# Patient Record
Sex: Female | Born: 1960
Health system: Southern US, Community
[De-identification: ages and names within clinical notes are randomized; demographics above are authoritative.]

## PROBLEM LIST (undated history)

## (undated) DIAGNOSIS — I471 Supraventricular tachycardia: Secondary | ICD-10-CM

## (undated) DIAGNOSIS — F419 Anxiety disorder, unspecified: Secondary | ICD-10-CM

## (undated) DIAGNOSIS — Z87442 Personal history of urinary calculi: Secondary | ICD-10-CM

## (undated) DIAGNOSIS — I441 Atrioventricular block, second degree: Secondary | ICD-10-CM

## (undated) DIAGNOSIS — Z950 Presence of cardiac pacemaker: Secondary | ICD-10-CM

## (undated) DIAGNOSIS — Z720 Tobacco use: Secondary | ICD-10-CM

## (undated) DIAGNOSIS — I4719 Other supraventricular tachycardia: Secondary | ICD-10-CM

## (undated) DIAGNOSIS — IMO0002 Reserved for concepts with insufficient information to code with codable children: Secondary | ICD-10-CM

## (undated) HISTORY — DX: Reserved for concepts with insufficient information to code with codable children: IMO0002

## (undated) HISTORY — PX: TONSILLECTOMY: SHX5217

## (undated) HISTORY — PX: TYMPANOSTOMY TUBE PLACEMENT: SHX32

## (undated) HISTORY — DX: Other supraventricular tachycardia: I47.19

## (undated) HISTORY — DX: Supraventricular tachycardia: I47.1

## (undated) HISTORY — DX: Tobacco use: Z72.0

## (undated) HISTORY — DX: Atrioventricular block, second degree: I44.1

---

## 2001-11-20 ENCOUNTER — Encounter: Admission: RE | Admit: 2001-11-20 | Discharge: 2001-11-20 | Payer: Self-pay | Admitting: Otolaryngology

## 2001-11-20 ENCOUNTER — Encounter: Payer: Self-pay | Admitting: Otolaryngology

## 2004-05-24 ENCOUNTER — Other Ambulatory Visit: Admission: RE | Admit: 2004-05-24 | Discharge: 2004-05-24 | Payer: Self-pay | Admitting: Obstetrics and Gynecology

## 2005-10-16 ENCOUNTER — Other Ambulatory Visit: Admission: RE | Admit: 2005-10-16 | Discharge: 2005-10-16 | Payer: Self-pay | Admitting: Obstetrics and Gynecology

## 2007-01-21 ENCOUNTER — Encounter: Admission: RE | Admit: 2007-01-21 | Discharge: 2007-01-21 | Payer: Self-pay | Admitting: Orthopaedic Surgery

## 2007-03-26 ENCOUNTER — Ambulatory Visit (HOSPITAL_BASED_OUTPATIENT_CLINIC_OR_DEPARTMENT_OTHER): Admission: RE | Admit: 2007-03-26 | Discharge: 2007-03-26 | Payer: Self-pay | Admitting: Orthopaedic Surgery

## 2010-01-14 ENCOUNTER — Encounter: Admission: RE | Admit: 2010-01-14 | Discharge: 2010-01-14 | Payer: Self-pay | Admitting: Obstetrics and Gynecology

## 2010-04-12 ENCOUNTER — Telehealth: Payer: Self-pay | Admitting: Cardiology

## 2010-04-19 ENCOUNTER — Ambulatory Visit: Payer: Self-pay | Admitting: Cardiovascular Disease

## 2010-04-26 ENCOUNTER — Ambulatory Visit (HOSPITAL_COMMUNITY): Admission: RE | Admit: 2010-04-26 | Discharge: 2010-04-26 | Payer: Self-pay | Admitting: Cardiovascular Disease

## 2010-04-26 ENCOUNTER — Encounter: Payer: Self-pay | Admitting: Cardiovascular Disease

## 2010-04-26 ENCOUNTER — Ambulatory Visit: Payer: Self-pay | Admitting: Cardiology

## 2010-04-26 ENCOUNTER — Ambulatory Visit: Payer: Self-pay

## 2010-05-02 ENCOUNTER — Ambulatory Visit: Payer: Self-pay | Admitting: Cardiovascular Disease

## 2010-05-07 HISTORY — PX: ATRIAL ABLATION SURGERY: SHX560

## 2010-05-18 DIAGNOSIS — I498 Other specified cardiac arrhythmias: Secondary | ICD-10-CM | POA: Insufficient documentation

## 2010-05-20 ENCOUNTER — Ambulatory Visit: Payer: Self-pay | Admitting: Internal Medicine

## 2010-05-25 ENCOUNTER — Telehealth (INDEPENDENT_AMBULATORY_CARE_PROVIDER_SITE_OTHER): Payer: Self-pay | Admitting: *Deleted

## 2010-05-26 ENCOUNTER — Encounter: Payer: Self-pay | Admitting: Internal Medicine

## 2010-05-30 ENCOUNTER — Telehealth: Payer: Self-pay | Admitting: Internal Medicine

## 2010-05-30 ENCOUNTER — Encounter: Payer: Self-pay | Admitting: Internal Medicine

## 2010-05-31 ENCOUNTER — Telehealth: Payer: Self-pay | Admitting: Internal Medicine

## 2010-06-02 ENCOUNTER — Inpatient Hospital Stay (HOSPITAL_COMMUNITY): Admission: RE | Admit: 2010-06-02 | Discharge: 2010-06-07 | Payer: Self-pay | Admitting: Internal Medicine

## 2010-06-02 ENCOUNTER — Ambulatory Visit: Payer: Self-pay | Admitting: Internal Medicine

## 2010-06-07 HISTORY — PX: PACEMAKER INSERTION: SHX728

## 2010-06-08 ENCOUNTER — Telehealth: Payer: Self-pay | Admitting: Internal Medicine

## 2010-06-08 ENCOUNTER — Inpatient Hospital Stay (HOSPITAL_COMMUNITY): Admission: EM | Admit: 2010-06-08 | Discharge: 2010-06-10 | Payer: Self-pay | Admitting: Internal Medicine

## 2010-06-08 ENCOUNTER — Ambulatory Visit: Payer: Self-pay | Admitting: Cardiology

## 2010-06-10 ENCOUNTER — Encounter: Payer: Self-pay | Admitting: Internal Medicine

## 2010-06-10 ENCOUNTER — Telehealth: Payer: Self-pay | Admitting: Internal Medicine

## 2010-06-15 ENCOUNTER — Encounter: Payer: Self-pay | Admitting: Internal Medicine

## 2010-06-16 ENCOUNTER — Telehealth: Payer: Self-pay | Admitting: Internal Medicine

## 2010-06-16 ENCOUNTER — Encounter: Payer: Self-pay | Admitting: Internal Medicine

## 2010-06-21 ENCOUNTER — Telehealth: Payer: Self-pay | Admitting: Internal Medicine

## 2010-06-22 ENCOUNTER — Encounter: Payer: Self-pay | Admitting: Internal Medicine

## 2010-06-22 ENCOUNTER — Ambulatory Visit: Payer: Self-pay | Admitting: Internal Medicine

## 2010-06-22 DIAGNOSIS — R509 Fever, unspecified: Secondary | ICD-10-CM | POA: Insufficient documentation

## 2010-06-22 DIAGNOSIS — I441 Atrioventricular block, second degree: Secondary | ICD-10-CM | POA: Insufficient documentation

## 2010-06-23 ENCOUNTER — Encounter: Payer: Self-pay | Admitting: Internal Medicine

## 2010-06-27 LAB — CONVERTED CEMR LAB
MCHC: 33.6 g/dL (ref 30.0–36.0)
Platelets: 340 10*3/uL (ref 150–400)
RDW: 13.3 % (ref 11.5–15.5)
WBC: 9.6 10*3/uL (ref 4.0–10.5)

## 2010-06-29 ENCOUNTER — Ambulatory Visit: Payer: Self-pay | Admitting: Internal Medicine

## 2010-07-06 ENCOUNTER — Telehealth: Payer: Self-pay | Admitting: Internal Medicine

## 2010-07-06 ENCOUNTER — Encounter: Payer: Self-pay | Admitting: Internal Medicine

## 2010-07-06 ENCOUNTER — Telehealth (INDEPENDENT_AMBULATORY_CARE_PROVIDER_SITE_OTHER): Payer: Self-pay | Admitting: *Deleted

## 2010-07-06 DIAGNOSIS — R0789 Other chest pain: Secondary | ICD-10-CM | POA: Insufficient documentation

## 2010-07-07 ENCOUNTER — Encounter: Payer: Self-pay | Admitting: Internal Medicine

## 2010-07-29 ENCOUNTER — Ambulatory Visit: Payer: Self-pay | Admitting: Internal Medicine

## 2010-09-06 ENCOUNTER — Ambulatory Visit: Payer: Self-pay | Admitting: Internal Medicine

## 2010-09-06 NOTE — Progress Notes (Signed)
Summary: re disabilty  Phone Note Call from Patient   Caller: Patient [phone] Reason for Call: Talk to Nurse Summary of Call: pt calling re disability Initial call taken by: Glynda Jaeger,  June 21, 2010 10:54 AM  Follow-up for Phone Call        pt to bring in copy of original form for me to correct and fax into her job.  Jocelyn aware and I will call her tomorrow and let her know when I refax Dennis Bast, RN, BSN  June 21, 2010 11:48 AM

## 2010-09-06 NOTE — Progress Notes (Signed)
Summary: Disability / calling back   Phone Note Call from Patient Call back at Home Phone [phone]   Caller: Patient Reason for Call: Talk to Nurse Summary of Call: Pt has question on Disability papers. pt also has question re dr allred wanted to see her.  Initial call taken by: Roe Coombs,  June 16, 2010 8:29 AM  Follow-up for Phone Call        per jocelyn office [phone] ext [phone].has question regarding paperwork Lorne Skeens  June 16, 2010 5:06 PM  [facility] for Grayce Sessions  I'm out of the office tomorrow  However back in office on Mon.  Spoke with pt will refax papers with 06/23/10 as return to work date on all  Dennis Bast, RN, BSN  June 16, 2010 6:16 PM

## 2010-09-06 NOTE — Assessment & Plan Note (Signed)
Summary: eph/mt   Visit Type:  Pacemaker check Referring Provider:  Dr Kirke Corin Primary Provider:  Harrison Mons, MD Mady Haagensen)  CC:  pacemaker F/U.  History of Present Illness: The patient presents today for routine electrophysiology followup.  Her recent SVT ablation for AVNRT was complicated by AV block which required pacemaker implantation.  She had microlead perforation and therefore required lead revision by Dr Ladona Ridgel.  Since that time, she has done reasonably well.  She reports having fevers and chills this past weekend as well as myalgias.  These symptoms appear to have resolved currently.   The patient denies symptoms of palpitations, chest pain, shortness of breath, orthopnea, PND, lower extremity edema, dizziness, presyncope, syncope, or neurologic sequela. The patient is tolerating medications without difficulties and is otherwise without complaint today.   Problems Prior to Update: 1)  Fever Unspecified  (ICD-780.60) 2)  Second Degree Atrioventricular Heart Block  (ICD-426.13) 3)  Supraventricular Tachycardia  (ICD-427.89)  Medications Prior to Update: 1)  Metoprolol Succinate 50 Mg Xr24h-Tab (Metoprolol Succinate) .... Take One Tablet By Mouth Once Daily 2)  Alprazolam 0.25 Mg Tabs (Alprazolam) .... Uad 3)  Methocarbamol 500 Mg Tabs (Methocarbamol) .... As Needed  Current Medications (verified): 1)  Clonazepam 0.5 Mg Tbdp (Clonazepam) .... Take 1 By Mouth Two Times A Day As Needed 2)  Protonix 40 Mg Tbec (Pantoprazole Sodium) .... Take 1 By Mouth Once Daily 3)  Indomethacin 25 Mg Caps (Indomethacin) .... Take 1 By Mouth Three Times A Day  Allergies: 1)  ! Penicillin 2)  ! * Claritn D  Past History:  Past Medical History: s/p slow pathway ablation for inducible AVNRT 10/11 pacemaker implantation 11/11 for AV block post ablation DDD s/p back surgery x 2 ongoing tobacco use  Past Surgical History: Reviewed history from 05/20/2010 and no changes required. back surgery  twice tonsellectomy endometrial ablation  Vital Signs:  Patient profile:   50 year old female Height:      65 inches Weight:      141 pounds BMI:     23.55 Pulse rate:   100 / minute BP sitting:   98 / 60  (left arm) Cuff size:   regular  Vitals Entered By: Stanton Kidney, EMT-P (June 22, 2010 12:11 PM)   Physical Exam  General:  Well developed, well nourished, in no acute distress. Head:  normocephalic and atraumatic Eyes:  PERRLA/EOM intact; conjunctiva and lids normal. Mouth:  Teeth, gums and palate normal. Oral mucosa normal. Neck:  Neck supple, no JVD. No masses, thyromegaly or abnormal cervical nodes. Chest Wall:  pacemaker pocket is healing nicely without evidence of infection Lungs:  Clear bilaterally to auscultation and percussion. Heart:  RRR, no m/r/g Abdomen:  Bowel sounds positive; abdomen soft and non-tender without masses, organomegaly, or hernias noted. No hepatosplenomegaly. Msk:  Back normal, normal gait. Muscle strength and tone normal. Pulses:  pulses normal in all 4 extremities Extremities:  No clubbing or cyanosis. Neurologic:  Alert and oriented x 3.   PPM Specifications Following MD:  Hillis Range, MD     PPM Vendor:  Medtronic     PPM Model Number:  [id number]     PPM Serial Number:  [id number] PPM DOI:  06/06/2010     PPM Implanting MD:  Lewayne Bunting, MD  Lead 1    Location: RA     DOI: 06/06/2010     Model #: 1308     Serial #: [id number]     Status: active Lead 2  Location: RV     DOI: 06/06/2010     Model #: [id number]     Serial #: [id number]     Status: active  Magnet Response Rate:  BOL 85 ERI 65  Indications:  CHB   PPM Follow Up Remote Check?  No Battery Voltage:  3.04 V     Pacer Dependent:  Yes       PPM Device Measurements Atrium  Amplitude: 2.4 mV, Impedance: 480 ohms, Threshold: 0.5 V at 0.4 msec Right Ventricle  Amplitude: 19.8 mV, Impedance: 520 ohms, Threshold: 0.5 V at 0.4 msec  Episodes MS Episodes:  0     Percent Mode  Switch:  0     Coumadin:  No Atrial Pacing:  0.2%     Ventricular Pacing:  99.8%  Parameters Mode:  DDD     Lower Rate Limit:  60     Upper Rate Limit:  130 Paced AV Delay:  180     Sensed AV Delay:  150 Next Cardiology Appt Due:  09/07/2010 Tech Comments:  Steri strips removed, no redness or edema noted.  Device  function normal.  No parameter changes.  Altha Harm, LPN  June 22, 2010 12:34 PM  MD Comments:  agree  Impression & Recommendations:  Problem # 1:  SECOND DEGREE ATRIOVENTRICULAR HEART BLOCK (ICD-426.13) s/p PPM, underlying rhythm today is 2:1 av block normal pacemaker function and pocket is healing nicely.  I am concerned about subjective fevers, chills, and myalgias from this past weekend.  This may represent sequela of pericardial inflammation s/p microperforation or viral syndrome.  The possibility for pacemaker system infection also exists.  I will therefore obtain CBC with diff and blood cultures today.  She will return in 1 week for re-examination and will contact us in the interim for further fevers or chills.  Other Orders: T-CBC w/Diff ([id number]) T-Culture, Blood AFB ([id number])  Patient Instructions: 1)  Your physician recommends that you schedule a follow-up appointment in: one week with Dr Johney Frame 2)  She needs blood cultures x2 and CBC with DIff 3)  Call if fever above 101

## 2010-09-06 NOTE — Progress Notes (Signed)
Summary: questions about medication   Phone Note From Pharmacy   Caller: [facility] [phone] Summary of Call: Pharmacy have question about medication Clonazepam  Initial call taken by: Judie Grieve,  June 10, 2010 1:46 PM  Follow-up for Phone Call         On D/C from [facility] hospital, pt. was prescrived Clonazepam 0.25 mg twice a day. The Pharmcist called to verify that was  fine to dispense Clonazepam 0.5 mg . Pt. to take 1/2 tablet twice a day. RN let pharmacist know that, was fine to dispense. Follow-up by: Ollen Gross, RN, BSN,  June 10, 2010 2:07 PM

## 2010-09-06 NOTE — Progress Notes (Addendum)
Summary: has question regarding 10/27 procedure  Phone Note From Other Clinic   Caller: JENNIFER FROM [employer]- [phone] OPTION [phone] Request: Talk with Nurse Details of Complaint: Pt having procedure on 10/27. has questions.  Initial call taken by: Lorne Skeens,  May 31, 2010 3:18 PM  Follow-up for Phone Call        spoke with Victorino Dike needs CPT codes and all clinicals faxed.  Thanks Dennis Bast, RN, BSN  May 31, 2010 3:23 PM  jennifer calling back to get clinical info and cpt codes faxed Glynda Jaeger  June 24, 2010 9:43 AM

## 2010-09-06 NOTE — Letter (Signed)
Summary: [facility]  [facility]   Imported By: Marylou Mccoy 06/27/2010 13:53:56  _____________________________________________________________________  External Attachment:    Type:   Image     Comment:   External Document

## 2010-09-06 NOTE — Medication Information (Signed)
Summary: [employer] Initial Review Notice   [employer] Initial Review Notice   Imported By: Roderic Ovens 07/12/2010 15:43:59  _____________________________________________________________________  External Attachment:    Type:   Image     Comment:   External Document

## 2010-09-06 NOTE — Cardiovascular Report (Signed)
Summary: Office Visit   Office Visit   Imported By: Roderic Ovens 07/14/2010 13:57:15  _____________________________________________________________________  External Attachment:    Type:   Image     Comment:   External Document

## 2010-09-06 NOTE — Letter (Signed)
Summary: Return To Work  [facility], Main Office  [address]   [place], [place] [address]   Phone: [phone]  Fax: [phone]    07/06/2010  TO: Leodis Sias IT MAY CONCERN   RE: Lori Gay [address] [id number]   The above named individual is under my medical care and will be out of work until Monday 07/11/10.  If you have any further questions or need additional information, please call.     Sincerely,     Dr. Fayrene Fearing Glendy Barsanti,MD/Kelly Darl Pikes, RN, BSN

## 2010-09-06 NOTE — Letter (Signed)
Summary: FMLA Papers/  FMLA Papers/   Imported By: Erle Crocker 06/17/2010 16:09:17  _____________________________________________________________________  External Attachment:    Type:   Image     Comment:   External Document

## 2010-09-06 NOTE — Progress Notes (Signed)
  Recieved papers via Fax from [employer] for Group Disability insurance sent to [facility]  May 25, 2010 1:09 PM

## 2010-09-06 NOTE — Letter (Signed)
Summary: ELectrophysiology/Ablation Procedure Instructions  [facility], Main Office  [address]   [place], [place] [address]   Phone: [phone]  Fax: [phone]     Electrophysiology/Ablation Procedure Instructions    You are scheduled for a(n) SVT ablation on 06/02/10 at 7:30am with Dr. Johney Frame.  1.  Please come to the Short Stay Center at [facility] at 5:30am on the day of your procedure.  2.  Come prepared to stay overnight.   Please bring your insurance cards and a list of your medications.  3.  Go to Dr Juliann Pulse office on 05/26/10 for lab work.   You do not have to be fasting.  4.  Do not have anything to eat or drink after midnight the night before your procedure.  5.  Do NOT take these medications for 2 days before your procedure unless otherwise instructed:  Metoprolol.  All of your remaining medications may be taken with a small amount of water.  6.  Educational material received  Ablation   * Occasionally, EP studies and ablations can become lengthy.  Please make your family aware of this before your procedure starts.  Average time ranges from 2-8 hours for EP studies/ablations.  Your physician will locate your family after the procedure with the results.  * If you have any questions after you get home, please call the office at [phone]. Dennis Bast, RN

## 2010-09-06 NOTE — Letter (Signed)
Summary: Return To Work  [facility] [employer], Main Office  [address]   [place], [place] [address]   Phone: [phone]  Fax: [phone]    06/22/2010  TO: Leodis Sias IT MAY CONCERN   RE: Lori Gay [address] [place]   The above named individual is under my medical care and may return to work on: 06/27/10.  If you have any further questions or need additional information, please call.     Sincerely,     Dr. Hillis Range, MD

## 2010-09-06 NOTE — Miscellaneous (Signed)
Summary: Device preload  Clinical Lists Changes  Observations: Added new observation of PPM INDICATN: CHB (06/15/2010 10:00) Added new observation of MAGNET RTE: BOL 85 ERI 65 (06/15/2010 10:00) Added new observation of PPMLEADSTAT2: active (06/15/2010 10:00) Added new observation of PPMLEADSER2: [id number] (06/15/2010 10:00) Added new observation of PPMLEADMOD2: 5086  (06/15/2010 10:00) Added new observation of PPMLEADLOC2: RV  (06/15/2010 10:00) Added new observation of PPMLEADSTAT1: active  (06/15/2010 10:00) Added new observation of PPMLEADSER1: [id number]  (06/15/2010 10:00) Added new observation of PPMLEADMOD1: 5086  (06/15/2010 10:00) Added new observation of PPMLEADLOC1: RA  (06/15/2010 10:00) Added new observation of PPM IMP MD: Lewayne Bunting, MD  (06/15/2010 10:00) Added new observation of PPMLEADDOI2: 06/06/2010  (06/15/2010 10:00) Added new observation of PPMLEADDOI1: 06/06/2010  (06/15/2010 10:00) Added new observation of PPM DOI: 06/06/2010  (06/15/2010 10:00) Added new observation of PPM SERL#: [id number]  (06/15/2010 10:00) Added new observation of PPM MODL#: [id number]  (06/15/2010 08:65) Added new observation of PACEMAKERMFG: Medtronic  (06/15/2010 10:00) Added new observation of PACEMAKER MD: Lewayne Bunting, MD  (06/15/2010 10:00)      PPM Specifications Following MD:  Lewayne Bunting, MD     PPM Vendor:  Medtronic     PPM Model Number:  [id number]     PPM Serial Number:  [id number] PPM DOI:  06/06/2010     PPM Implanting MD:  Lewayne Bunting, MD  Lead 1    Location: RA     DOI: 06/06/2010     Model #: 4401     Serial #: [id number]     Status: active Lead 2    Location: RV     DOI: 06/06/2010     Model #: 4034     Serial #: [id number]     Status: active  Magnet Response Rate:  BOL 85 ERI 65  Indications:  CHB

## 2010-09-06 NOTE — Progress Notes (Signed)
Summary: pt needs note to be out of work  Phone Note Call from Patient Call back at Work Phone [phone]   Caller: Patient Reason for Call: Talk to Nurse, Talk to Doctor Summary of Call: pt needs a note faxed for her work to be out for surgery pls fax to [phone] attn: Grayce Sessions Initial call taken by: Omer Jack,  May 30, 2010 11:36 AM  Follow-up for Phone Call        note done and faxed Dennis Bast, RN, BSN  May 30, 2010 3:38 PM

## 2010-09-06 NOTE — Progress Notes (Signed)
Summary: chest pain and hurts to breath  Phone Note Call from Patient   Caller: Patient Reason for Call: Talk to Nurse Summary of Call: pt was dc from hospital 11.1.11. pt is having chest pain and hurts to breath Initial call taken by: Roe Coombs,  June 08, 2010 3:27 PM  Follow-up for Phone Call        pt. having alot of CP. since 11:30am. She was taking Ibuprofen and robaxin and it improved some but is now coming back. Her jaw is starting to hurt.  Her pain is 10/10. Only SOB with breathing. Spoke with Tresa Endo who is working with Dr. Ladona Ridgel who has decided to admit pt. to 3700 for lead revision tomorrow. (See Below) Whitney Maeola Sarah RN  June 08, 2010 4:22 PM  Follow-up by: Whitney Maeola Sarah RN,  June 08, 2010 4:22 PM  Additional Follow-up for Phone Call Additional follow up Details #1::        spoke with Dr Ladona Ridgel regarding pt.  She has taken Ibuprofen three times today and Roboxin the pain is getting progressively worse.  She is s/p SVT ablation w/ CHB and PPM implant secondary to CHB.  Now having CP and SOB.  States hurts worse to take a deep breath.  Will admit patient to 3700.  She will be place on board for lead revision tomorrow with Dr Ladona Ridgel.  Pt aware and will come to [facility] now Dennis Bast, RN, BSN  June 08, 2010 4:19 PM

## 2010-09-06 NOTE — Medication Information (Signed)
Summary: [employer] Initial Review Notice   [employer] Initial Review Notice   Imported By: Roderic Ovens 07/12/2010 15:45:32  _____________________________________________________________________  External Attachment:    Type:   Image     Comment:   External Document

## 2010-09-06 NOTE — Letter (Signed)
Summary: Return To Work  [facility], Main Office  [address]   [place], [place] [address]   Phone: [phone]  Fax: [phone]    05/30/2010  TO: WHOM IT MAY CONCERN   RE: Lori Gay [address] [place]   The above named individual is under my medical care and will be out of work starting 06/02/10 and will return to work on 06/13/10.  If you have any further questions or need additional information, please call.     Sincerely,    Dr. Hillis Range

## 2010-09-06 NOTE — Progress Notes (Signed)
Summary: PAIN UNDER LEFT BREAST  Phone Note Call from Patient Call back at Work Phone [phone]   Caller: Patient Summary of Call: PT HAVING PAINS UNDER LEFT BREAST Initial call taken by: Judie Grieve,  July 06, 2010 9:24 AM  Follow-up for Phone Call        spoke with pt she describes pain as more walks more it hurts when she starts moving around it starts hurting again.  Under left breast hurts worse with deep breath.  Started Monday and has just lingered  Took Indomethacin 25mg  once yesterday  It helped some but she wants Dr Johney Frame To see her  I asked pt to come on to the office Dennis Bast, RN, BSN  July 06, 2010 10:02 AM   Additional Follow-up for Phone Call Additional follow up Details #1::        BP 100/80   will obtain echo Dennis Bast, RN, BSN  July 06, 2010 11:33 AM Echo looked good Dr Johney Frame instructed patient to take Indomethacin 25mg  three times a day for the next two days then decrease to twice daily and wean off.  Patient verbalized understanding.  Note written for her to stay out of work until Monday 07/11/10 Dennis Bast, RN, BSN  July 06, 2010 12:02 PM Additional Follow-up by: Hillis Range, MD,  July 06, 2010 2:38 PM     Appended Document: [facility]     Referring Provider:  Dr Kirke Corin Primary Provider:  Harrison Mons, MD Mady Haagensen)   History of Present Illness: Lori Gay presents today for evaluation of sharp pain under her L breast.  This pain is worse with deep inspiration and chest wall movement.  Pain has been present intermittently for 2 days and is improved with indomethacin.  She denies cough, SOB, fevers, chills, or other symptoms.  She is otherwise without complaint today.  Allergies: 1)  ! Penicillin 2)  ! * Claritn D  Past History:  Past Medical History: Reviewed history from 06/22/2010 and no changes required. s/p slow pathway ablation for inducible AVNRT 10/11 pacemaker implantation 11/11 for AV block post  ablation DDD s/p back surgery x 2 ongoing tobacco use  Social History: Reviewed history from 05/20/2010 and no changes required. Pt lives in [place] with boyfriend.  She works at [employer].  Tob- smokes 1PPD x 15 years.  ETOH- 1-2 glasses of wine per day  Physical Exam  General:  Well developed, well nourished, in no acute distress. Head:  normocephalic and atraumatic Eyes:  PERRLA/EOM intact; conjunctiva and lids normal. Mouth:  Teeth, gums and palate normal. Oral mucosa normal. Neck:  supple Chest Wall:  pacemaker pocket is healing nicely Lungs:  Clear bilaterally to auscultation and percussion. Heart:  RRR, no m/r/g Abdomen:  Bowel sounds positive; abdomen soft and non-tender without masses, organomegaly, or hernias noted. No hepatosplenomegaly. Msk:  moderate ttp over the L axillar and lower rib cage which reproduces her symptoms Extremities:  No clubbing or cyanosis. Neurologic:  Alert and oriented x 3.   PPM Specifications Following MD:  Hillis Range, MD     PPM Vendor:  [employer]tronic     PPM Model Number:  [id number]     PPM Serial Number:  [id number] PPM DOI:  06/06/2010     PPM Implanting MD:  Lewayne Bunting, MD  Lead 1    Location: RA     DOI: 06/06/2010     Model #: 1660     Serial #: [id number]     Status:  active Lead 2    Location: RV     DOI: 06/06/2010     Model #: 2956     Serial #: [id number]     Status: active  Magnet Response Rate:  BOL 85 ERI 65  Indications:  CHB   PPM Follow Up Pacer Dependent:  Yes      Episodes Coumadin:  No  Parameters Mode:  DDD     Lower Rate Limit:  60     Upper Rate Limit:  130 Paced AV Delay:  180     Sensed AV Delay:  150 MD Comments:  normal device parameters underlying rhythm is 2:1 av block  Impression & Recommendations:  Problem # 1:  CHEST PAIN, ATYPICAL (ICD-786.59) The patient presents with atypical chest pain, likely due to residual pericardial inflammation vs chest wall pain.  She is not dyspneic and  appears stable today.  Her pacemaker parameters are also stable. I have reviewed a limited echo performed this am by tech Alona Bene which reveals trivial pericardial effusion without tamponade.  RV/LV size and function are normal.  No significant abnormalities are seen.  She will continue indomethacin and contact my office if symtoms do not resolve within several days. She has asked to be excused from work for the remainder of the week.  I think that this is reasonble.

## 2010-09-06 NOTE — Progress Notes (Signed)
----   Converted from flag ---- Flag Received   ---- 07/06/2010 11:55 AM, Bary Leriche wrote: Minerva Areola, Could you please check and see if Dr. Johney Frame has reviewed the disability forms for patient Shondell Fabel dob 10/10/2060.  It was sent to Dr. on 11-1 and we have not received it back yet.  Thank you. Elease Hashimoto at [employer]. ------------------------------  Appended Document:  Tresa Endo, what is the status on these. As I recall, we filled these out a while ago.

## 2010-09-06 NOTE — Assessment & Plan Note (Signed)
Summary: nep/svt/mt   Visit Type:  Initial Consult Referring Provider:  Dr Lori Gay Primary Provider:  Harrison Mons, MD Lori Gay)  CC:  SVT.  History of Present Illness: [name] is a pleasant 50 yo WF with a h/o tachypalpitations who presents today for EP consultation.  She reports abrupt onset and offset of heart "racing" for years.  These episodes have increased in frequency and duration over the past few years.  She reports 5 years ago receiving IV adenosine with successful termination of tachycardia.  She has tried vagal maneuvers without improvement.  Most recently, she had an episode 8/11 which lasted 4 hours.  She presented to [facility] and was treated with adenosine which terminated her tachycardia.  She was placed on metoprolol.  She reports very short episodes of palpitations since that time. She reports associated dizziness and lightheadedness during SVT but no syncope.  She reports mild dyspnea, but no chest pain.  The patient otherwise is healthy other than tobacco use.  She has no chronic medical conditions.  She denies any excessive alcohol or excessive caffeine intake.    Current Medications (verified): 1)  Metoprolol Succinate 50 Mg Xr24h-Tab (Metoprolol Succinate) .... Take One Tablet By Mouth Once Daily 2)  Alprazolam 0.25 Mg Tabs (Alprazolam) .... Uad 3)  Methocarbamol 500 Mg Tabs (Methocarbamol) .... As Needed  Allergies (verified): 1)  ! Penicillin 2)  ! * Claritn D  Past History:  Past Medical History: SUPRAVENTRICULAR TACHYCARDIA DDD s/p back surgery x 2 ongoing tobacco use  Past Surgical History: back surgery twice tonsellectomy endometrial ablation  Family History: diabetes  Social History: Pt lives in [place] with boyfriend.  She works at [employer].  Tob- smokes 1PPD x 15 years.  ETOH- 1-2 glasses of wine per day  Review of Systems       All systems are reviewed and negative except as listed in the HPI.   Vital  Signs:  Patient profile:   49 year old female Height:      65 inches Weight:      147 pounds BMI:     24.55 Pulse rate:   69 / minute BP sitting:   128 / 82  (left arm)  Vitals Entered By: Lori Gay CMA (May 20, 2010 12:04 PM)  Physical Exam  General:  Well developed, well nourished, in no acute distress. Head:  normocephalic and atraumatic Eyes:  PERRLA/EOM intact; conjunctiva and lids normal. Mouth:  Teeth, gums and palate normal. Oral mucosa normal. Neck:  Neck supple, no JVD. No masses, thyromegaly or abnormal cervical nodes. Lungs:  Clear bilaterally to auscultation and percussion. Heart:  Non-displaced PMI, chest non-tender; regular rate and rhythm, S1, S2 without murmurs, rubs or gallops. Carotid upstroke normal, no bruit. Normal abdominal aortic size, no bruits. Femorals normal pulses, no bruits. Pedals normal pulses. No edema, no varicosities. Abdomen:  Bowel sounds positive; abdomen soft and non-tender without masses, organomegaly, or hernias noted. No hepatosplenomegaly. Msk:  Back normal, normal gait. Muscle strength and tone normal. Pulses:  pulses normal in all 4 extremities Extremities:  No clubbing or cyanosis. Neurologic:  Alert and oriented x 3. Skin:  Intact without lesions or rashes. Cervical Nodes:  no significant adenopathy Psych:  Normal affect.   Echocardiogram  Procedure date:  04/26/2010  Findings:       Echocardiogram  Procedure date:  04/26/2010  Findings:        Study Conclusions    - Left ventricle: The cavity size was normal. Wall  thickness was     normal. Systolic function was normal. The estimated ejection     fraction was in the range of 55% to 60%. Wall motion was normal;     there were no regional wall motion abnormalities. Left ventricular     diastolic function parameters were normal.   - Aortic valve: There was no stenosis.   - Mitral valve: Trivial regurgitation.   - Right ventricle: The cavity size was normal. Systolic  function was     normal.   - Pulmonary arteries: PA peak pressure: 22mm Hg (S).   - Inferior vena cava: The vessel was normal in size; the     respirophasic diameter changes were in the normal range (= 50%);     findings are consistent with normal central venous pressure.   Transthoracic echocardiography. M-mode, complete 2D, spectral   Doppler, and color Doppler. Height: Height: 165.1cm. Height: 65in.   Weight: Weight: 55.3kg. Weight: 121.7lb. Body mass index: BMI:   20.3kg/m^2. Body surface area: BSA: 1.69m^2. Blood pressure: 117/87.   Patient status: Outpatient. Location: [facility]  Left atrium                        Mitral valve   AP dim           27 mm     ------  Peak E vel    95.8 cm/s   -------   AP dim index   1.69 cm/m^2 <2.2    Peak A vel    63.2 cm/s   -------                      Prepared and Electronically Authenticated by    Lori Ancona, MD   [id number]     EKG  Procedure date:  05/22/2010  Findings:      sinus rhythm 69 bpm, PR 154, otherwise normal ekg  Impression & Recommendations:  Problem # 1:  SUPRAVENTRICULAR TACHYCARDIA (ICD-427.89) The patient has symptomatic recurrent SVT previously responsive to adenosine.  She has failed medical therapy and vagal maneuvers have not been helpful.  Therapeutic strategies for SVT including medicine and ablation were discussed in detail with the patient today. Risk, benefits, and alternatives to EP study and radiofrequency ablation were also discussed in detail today. These risks include but are not limited to stroke, bleeding, vascular damage, tamponade, perforation, damage to the heart and other structures, AV block requiring pacemaker, worsening renal function, and death. The patient understands these risk and wishes to proceed.  We will therefore scheduled carto guided EP study and ablation at the next available time.

## 2010-09-06 NOTE — Miscellaneous (Signed)
Summary: blood cultures x 2  Clinical Lists Changes  Orders: Added new Test order of T-Culture, Blood Routine ([id number]) - Signed

## 2010-09-06 NOTE — Progress Notes (Signed)
Summary: question re med  Phone Note Call from Patient   Caller: Patient Reason for Call: Talk to Nurse Summary of Call: pt seen in [place] [facility] for rapid heartbeat-was given metoprolol and won't have enough to last until appt-should she just finish med then come in or does she need refill? uses [facility] pt [phone] or [phone] Initial call taken by: Glynda Jaeger,  April 12, 2010 4:49 PM  Follow-up for Phone Call        I talked with pt--she was seen in [place] [facility] 03/23/10 for fast heart rate and was given Metoprolol--she states the first appointment available here was 05/12/10--she has 11 metoprolol left--I recommended that she call the [place] office to get an appointment there sooner --she agreed with this plan and will call [place] office back and cancel the appointment here if she gets a sooner appt in [place]

## 2010-09-06 NOTE — Letter (Signed)
Summary: [employer] Gwendolyn Lima papers  [employer] [facility]yn [place] papers   Imported By: Cala Bradford Mesiemore 06/17/2010 16:08:39  _____________________________________________________________________  External Attachment:    Type:   Image     Comment:   External Document

## 2010-09-06 NOTE — Assessment & Plan Note (Signed)
Summary: ok per kelly/per check outsf   Visit Type:  Follow-up Referring Gari Hartsell:  Dr Kirke Corin Primary Elias Dennington:  Harrison Mons, MD Mady Haagensen)  CC:   .  History of Present Illness: The patient presents today for electrophysiology followup.  Since her last visit, she has done reasonably well.  Her fevers have resolved.  She remains slightly fatigued.   The patient denies symptoms of palpitations, chest pain, shortness of breath, orthopnea, PND, lower extremity edema, dizziness, presyncope, syncope, or neurologic sequela. The patient is tolerating medications without difficulties and is otherwise without complaint today.   Problems Prior to Update: 1)  Fever Unspecified  (ICD-780.60) 2)  Second Degree Atrioventricular Heart Block  (ICD-426.13) 3)  Supraventricular Tachycardia  (ICD-427.89)  Medications Prior to Update: 1)  Clonazepam 0.5 Mg Tbdp (Clonazepam) .... Take 1 By Mouth Two Times A Day As Needed 2)  Protonix 40 Mg Tbec (Pantoprazole Sodium) .... Take 1 By Mouth Once Daily 3)  Indomethacin 25 Mg Caps (Indomethacin) .... Take 1 By Mouth Three Times A Day  Current Medications (verified): 1)  Clonazepam 0.5 Mg Tbdp (Clonazepam) .... Take 1 By Mouth Two Times A Day As Needed 2)  Protonix 40 Mg Tbec (Pantoprazole Sodium) .... Take 1 By Mouth Once Daily 3)  Indomethacin 25 Mg Caps (Indomethacin) .... Take 1 By Mouth Three Times A Day, As Needed 4)  Robaxin 500 Mg Tabs (Methocarbamol) .... Every 8 Hrs, As Needed  Allergies (verified): 1)  ! Penicillin 2)  ! * Claritn D  Past History:  Past Medical History: Reviewed history from 06/22/2010 and no changes required. s/p slow pathway ablation for inducible AVNRT 10/11 pacemaker implantation 11/11 for AV block post ablation DDD s/p back surgery x 2 ongoing tobacco use  Past Surgical History: Reviewed history from 05/20/2010 and no changes required. back surgery twice tonsellectomy endometrial ablation  Vital Signs:  Patient profile:    50 year old female Height:      65 inches Weight:      145.50 pounds BMI:     24.30 Pulse rate:   92 / minute BP sitting:   98 / 62  (left arm) Cuff size:   regular  Vitals Entered By: Caralee Ates CMA (June 29, 2010 4:33 PM)  Physical Exam  General:  Well developed, well nourished, in no acute distress. Head:  normocephalic and atraumatic Eyes:  PERRLA/EOM intact; conjunctiva and lids normal. Mouth:  Teeth, gums and palate normal. Oral mucosa normal. Neck:  Neck supple, no JVD. No masses, thyromegaly or abnormal cervical nodes. Chest Wall:  pacemaker pocket is healing nicely without evidence of infection Lungs:  Clear bilaterally to auscultation and percussion. Heart:  RRR, no m/r/g Abdomen:  Bowel sounds positive; abdomen soft and non-tender without masses, organomegaly, or hernias noted. No hepatosplenomegaly. Msk:  Back normal, normal gait. Muscle strength and tone normal. Pulses:  pulses normal in all 4 extremities Extremities:  No clubbing or cyanosis. Neurologic:  Alert and oriented x 3.   PPM Specifications Following MD:  Hillis Range, MD     PPM Vendor:  [employer]     PPM Model Number:  [id number]     PPM Serial Number:  [id number] PPM DOI:  06/06/2010     PPM Implanting MD:  Lewayne Bunting, MD  Lead 1    Location: [facility]     DOI: 06/06/2010     Model #: 4098     Serial #: [id number]     Status: active Lead 2  Location: RV     DOI: 06/06/2010     Model #: 7846     Serial #: [id number]     Status: active  Magnet Response Rate:  BOL 85 ERI 65  Indications:  CHB   PPM Follow Up Pacer Dependent:  Yes      Episodes Coumadin:  No  Parameters Mode:  DDD     Lower Rate Limit:  60     Upper Rate Limit:  130 Paced AV Delay:  180     Sensed AV Delay:  150  Impression & Recommendations:  Problem # 1:  FEVER UNSPECIFIED (ICD-780.60) resolved blood cultures negative x 2 WBC normal and pacemaker pocket looks good  Hopefully fevers are due to resolving pericardial  inflammation.    Problem # 2:  SECOND DEGREE ATRIOVENTRICULAR HEART BLOCK (ICD-426.13) stable no changes today  Patient Instructions: 1)  Your physician recommends that you schedule a follow-up appointment in: 4 weeks with Dr Johney Frame 2)  She will contact our office if any problems arise in the interim

## 2010-09-06 NOTE — Procedures (Signed)
Summary: [facility]   Current Medications (verified): 1)  Clonazepam 0.5 Mg Tbdp (Clonazepam) .... Take 1 By Mouth Two Times A Day As Needed 2)  Protonix 40 Mg Tbec (Pantoprazole Sodium) .... Take 1 By Mouth Once Daily 3)  Indomethacin 25 Mg Caps (Indomethacin) .... Take 1 By Mouth Three Times A Day, As Needed 4)  Robaxin 500 Mg Tabs (Methocarbamol) .... Every 8 Hrs, As Needed  Allergies (verified): 1)  ! Penicillin 2)  ! * Claritn D  PPM Specifications Following MD:  Hillis Range, MD     PPM Vendor:  [employer]     PPM Model Number:  [id number]     PPM Serial Number:  [id number] PPM DOI:  06/06/2010     PPM Implanting MD:  Lewayne Bunting, MD  Lead 1    Location: RA     DOI: 06/06/2010     Model #: 4098     Serial #: [id number]     Status: active Lead 2    Location: RV     DOI: 06/06/2010     Model #: 5621     Serial #: [id number]     Status: active  Magnet Response Rate:  BOL 85 ERI 65  Indications:  CHB   PPM Follow Up Battery Voltage:  3.03 V     Pacer Dependent:  Yes       PPM Device Measurements Atrium  Amplitude: 2.5 mV, Impedance: 496 ohms, Threshold: 0.5 V at 0.4 msec Right Ventricle  Impedance: 552 ohms, Threshold: 1.0 V at 0.4 msec  Episodes MS Episodes:  0     Coumadin:  No Ventricular High Rate:  0     Atrial Pacing:  0.5%     Ventricular Pacing:  99.8%  Parameters Mode:  DDD     Lower Rate Limit:  60     Upper Rate Limit:  130 Paced AV Delay:  180     Sensed AV Delay:  150 Next Cardiology Appt Due:  07/29/2010 Tech Comments:  NORMAL DEVICE FUNCTION.  NO EPISODES SINCE LAST CHECK.  NO CHANGES MADE. ROV 07-29-10 @ 1500 W/JA. Vella Kohler  July 07, 2010 4:25 PM

## 2010-09-08 NOTE — Cardiovascular Report (Signed)
Summary: Office Visit   Office Visit   Imported By: Roderic Ovens 08/12/2010 12:29:11  _____________________________________________________________________  External Attachment:    Type:   Image     Comment:   External Document

## 2010-09-08 NOTE — Cardiovascular Report (Signed)
Summary: Office Visit   Office Visit   Imported By: Roderic Ovens 07/18/2010 13:12:42  _____________________________________________________________________  External Attachment:    Type:   Image     Comment:   External Document

## 2010-09-08 NOTE — Assessment & Plan Note (Signed)
Summary: pc2/ gd   Visit Type:  Follow-up Referring Provider:  Dr Kirke Corin Primary Provider:  Harrison Mons, MD Mady Haagensen)   History of Present Illness: [name] presents today for EP follow-up.  She states that the pain under her L breast has much improved.  She rarely has episodes at this time and feels that this pain quickly resolves with NSAIDs.   She denies cough, SOB, fevers, chills, or other symptoms.  She is otherwise without complaint today.  Current Medications (verified): 1)  Clonazepam 0.5 Mg Tbdp (Clonazepam) .... Take 1 By Mouth Two Times A Day As Needed 2)  Protonix 40 Mg Tbec (Pantoprazole Sodium) .... Take 1 By Mouth Once Daily 3)  Indomethacin 25 Mg Caps (Indomethacin) .... Take 1 By Mouth Three Times A Day, As Needed 4)  Robaxin 500 Mg Tabs (Methocarbamol) .... Every 8 Hrs, As Needed  Allergies: 1)  ! Penicillin 2)  ! * Claritn D  Past History:  Past Medical History: Reviewed history from 06/22/2010 and no changes required. s/p slow pathway ablation for inducible AVNRT 10/11 pacemaker implantation 11/11 for AV block post ablation DDD s/p back surgery x 2 ongoing tobacco use  Past Surgical History: Reviewed history from 05/20/2010 and no changes required. back surgery twice tonsellectomy endometrial ablation  Social History: Pt lives in [place] and has recently married.  She works at [employer].  Tob- smokes 1PPD x 15 years.  ETOH- 1-2 glasses of wine per day  Vital Signs:  Patient profile:   50 year old female Height:      65 inches Weight:      143 pounds BMI:     23.88 Pulse rate:   95 / minute BP sitting:   126 / 72  (left arm)  Vitals Entered By: Laurance Flatten CMA (July 29, 2010 3:08 PM)  Physical Exam  General:  Well developed, well nourished, in no acute distress. Head:  normocephalic and atraumatic Eyes:  PERRLA/EOM intact; conjunctiva and lids normal. Mouth:  Teeth, gums and palate normal. Oral mucosa normal. Neck:   supple Chest Wall:  pacemaker pocket is healing nicely,  there was a 0.18mm stitch exposed and not dissolved.  This stitch was easily removed with forceps and the area cleaned with alcohol prep.  A bandade was applied.  There was no drainage/ exudate/ warmth/ redness, or any concerning findings Lungs:  Clear bilaterally to auscultation and percussion. Heart:  RRR, no m/r/g Abdomen:  Bowel sounds positive; abdomen soft and non-tender without masses, organomegaly, or hernias noted. No hepatosplenomegaly. Msk:  moderate ttp over the L axillar and lower rib cage which reproduces her symptoms Extremities:  No clubbing or cyanosis. Neurologic:  Alert and oriented x 3.   PPM Specifications Following MD:  Hillis Range, MD     PPM Vendor:  [employer]     PPM Model Number:  [id number]     PPM Serial Number:  [id number] PPM DOI:  06/06/2010     PPM Implanting MD:  Lewayne Bunting, MD  Lead 1    Location: RA     DOI: 06/06/2010     Model #: 4098     Serial #: [id number]     Status: active Lead 2    Location: RV     DOI: 06/06/2010     Model #: 5621     Serial #: [id number]     Status: active  Magnet Response Rate:  BOL 85 ERI 65  Indications:  CHB   PPM Follow  Up Remote Check?  No Battery Voltage:  3.03 V     Pacer Dependent:  Yes       PPM Device Measurements Atrium  Amplitude: 2.8 mV, Impedance: 568 ohms, Threshold: 0.5 V at 0.4 msec Right Ventricle  Amplitude: 15.7 mV, Impedance: 520 ohms, Threshold: 0.5 V at 0.4 msec  Episodes MS Episodes:  0     Percent Mode Switch:  0     Coumadin:  No Ventricular High Rate:  0     Atrial Pacing:  0.5%     Ventricular Pacing:  99.7%  Parameters Mode:  DDD     Lower Rate Limit:  60     Upper Rate Limit:  130 Paced AV Delay:  180     Sensed AV Delay:  150 Next Cardiology Appt Due:  05/08/2011 Tech Comments:  Outputs reprogrammed for chronic thresholds.  Device function normal.  ROV 10/12 with Dr. Johney Frame. Altha Harm, LPN  July 29, 2010 3:17 PM  MD  Comments:  normal device function  Impression & Recommendations:  Problem # 1:  CHEST PAIN, ATYPICAL (ICD-786.59) improved intermittent NSAIDs she will contact my office if she develops further progression of chest pain, SOB, or any other concerns  Problem # 2:  SECOND DEGREE ATRIOVENTRICULAR HEART BLOCK (ICD-426.13) normal pacemaker function  Problem # 3:  SUPRAVENTRICULAR TACHYCARDIA (ICD-427.89) resolved  Problem # 4:  FEVER UNSPECIFIED (ICD-780.60) likely due to prior pericarditis,  cultures were negative fevers have completely resolved and pacemaker pocket looks good.  Patient Instructions: 1)  Your physician recommends that you schedule a follow-up appointment in: 2 months with Dr Johney Frame

## 2010-09-29 ENCOUNTER — Encounter: Payer: Self-pay | Admitting: Internal Medicine

## 2010-09-29 ENCOUNTER — Encounter (INDEPENDENT_AMBULATORY_CARE_PROVIDER_SITE_OTHER): Payer: PRIVATE HEALTH INSURANCE | Admitting: Internal Medicine

## 2010-09-29 DIAGNOSIS — I442 Atrioventricular block, complete: Secondary | ICD-10-CM

## 2010-09-29 DIAGNOSIS — I441 Atrioventricular block, second degree: Secondary | ICD-10-CM

## 2010-09-29 DIAGNOSIS — F17200 Nicotine dependence, unspecified, uncomplicated: Secondary | ICD-10-CM | POA: Insufficient documentation

## 2010-10-04 NOTE — Assessment & Plan Note (Signed)
Summary: 2 TreatmentWindow.is pacer check   Visit Type:  PPM-[employer] Referring Provider:  Dr Kirke Corin Primary Provider:  Harrison Mons, MD Mady Haagensen)  CC:  no complaints.  History of Present Illness: The patient presents today for routine electrophysiology followup. She reports doing very well since last being seen in our clinic.  She has no further chest pain.  She feels that her energy is much improved.  She has returned to work full time and is doing very well.  She reports rare mild discomfort over her pacemaker site.  She denies symptoms of palpitations, shortness of breath, orthopnea, PND, lower extremity edema, dizziness, presyncope, syncope, fevers, chills, or neurologic sequela. The patient is tolerating medications without difficulties and is otherwise without complaint today.   Current Medications (verified): 1)  Robaxin 500 Mg Tabs (Methocarbamol) .... Every 8 Hrs, As Needed  Allergies (verified): 1)  ! Penicillin 2)  ! * Claritn D  Past History:  Past Medical History: Reviewed history from 06/22/2010 and no changes required. s/p slow pathway ablation for inducible AVNRT 10/11 pacemaker implantation 11/11 for AV block post ablation DDD s/p back surgery x 2 ongoing tobacco use  Past Surgical History: Reviewed history from 05/20/2010 and no changes required. back surgery twice tonsellectomy endometrial ablation  Social History: Reviewed history from 07/29/2010 and no changes required. Pt lives in [place] and has recently married.  She works at [employer].  Tob- smokes 1PPD x 15 years.  ETOH- 1-2 glasses of wine per day  Vital Signs:  Patient profile:   50 year old female Height:      65 inches Weight:      140.25 pounds BMI:     23.42 Pulse rate:   79 / minute BP sitting:   111 / 82  (left arm) Cuff size:   regular  Vitals Entered By: Caralee Ates CMA (September 29, 2010 4:15 PM)  Physical Exam  General:  Well developed, well nourished, in no  acute distress. Head:  normocephalic and atraumatic Eyes:  PERRLA/EOM intact; conjunctiva and lids normal. Mouth:  Teeth, gums and palate normal. Oral mucosa normal. Neck:  supple Chest Wall:  Pacemaker pocket is well healed and nontender, no evidence of infection Lungs:  Clear bilaterally to auscultation and percussion. Heart:  Non-displaced PMI, chest non-tender; regular rate and rhythm, S1, S2 without murmurs, rubs or gallops. Carotid upstroke normal, no bruit. Normal abdominal aortic size, no bruits. Femorals normal pulses, no bruits. Pedals normal pulses. No edema, no varicosities. Abdomen:  Bowel sounds positive; abdomen soft and non-tender without masses, organomegaly, or hernias noted. No hepatosplenomegaly. Msk:  Back normal, normal gait. Muscle strength and tone normal. Extremities:  No clubbing or cyanosis. Neurologic:  Alert and oriented x 3. Skin:  Intact without lesions or rashes. Psych:  Normal affect.   PPM Specifications Following MD:  Hillis Range, MD     PPM Vendor:  [employer]     PPM Model Number:  [id number]     PPM Serial Number:  [id number] PPM DOI:  06/06/2010     PPM Implanting MD:  Lewayne Bunting, MD  Lead 1    Location: RA     DOI: 06/06/2010     Model #: [id number]     Serial #: [id number]     Status: active Lead 2    Location: RV     DOI: 06/06/2010     Model #: 8657     Serial #: [id number]     Status: active  Magnet Response  Rate:  BOL 85 ERI 65  Indications:  CHB   PPM Follow Up Pacer Dependent:  Yes      Episodes Coumadin:  No  Parameters Mode:  DDD     Lower Rate Limit:  60     Upper Rate Limit:  130 Paced AV Delay:  180     Sensed AV Delay:  150 MD Comments:  see scanned report  Impression & Recommendations:  Problem # 1:  SECOND DEGREE ATRIOVENTRICULAR HEART BLOCK (ICD-426.13) doing very well s/p PPM implantation normal pacemaker function today see scanned report  Problem # 2:  CHEST PAIN, ATYPICAL (ICD-786.59) resolved  Problem # 3:   SUPRAVENTRICULAR TACHYCARDIA (ICD-427.89) resolved s/p ablation  Problem # 4:  NICOTINE ADDICTION (ICD-305.1) cessation of tobacco advised  Patient Instructions: 1)  Your physician recommends that you schedule a follow-up appointment in: october with Dr. Johney Frame 2)  Your physician recommends that you continue on your current medications as directed. Please refer to the Current Medication list given to you today.

## 2010-10-13 NOTE — Cardiovascular Report (Signed)
Summary: Office Visit   Office Visit   Imported By: Roderic Ovens 10/07/2010 12:33:53  _____________________________________________________________________  External Attachment:    Type:   Image     Comment:   External Document

## 2010-10-18 LAB — BASIC METABOLIC PANEL
BUN: 11 mg/dL (ref 6–23)
CO2: 23 mEq/L (ref 19–32)
Calcium: 8.7 mg/dL (ref 8.4–10.5)
Chloride: 112 mEq/L (ref 96–112)
Glucose, Bld: 101 mg/dL — ABNORMAL HIGH (ref 70–99)
Potassium: 3.6 mEq/L (ref 3.5–5.1)
Potassium: 3.6 mEq/L (ref 3.5–5.1)
Sodium: 140 mEq/L (ref 135–145)
Sodium: 141 mEq/L (ref 135–145)

## 2010-10-18 LAB — BLOOD GAS, ARTERIAL
Acid-Base Excess: 0 mmol/L (ref 0.0–2.0)
Drawn by: 34270
O2 Saturation: 96.2 %
Patient temperature: 98.6
TCO2: 20.8 mmol/L (ref 0–100)
pCO2 arterial: 33.2 mmHg — ABNORMAL LOW (ref 35.0–45.0)
pO2, Arterial: 77 mmHg — ABNORMAL LOW (ref 80.0–100.0)

## 2010-10-18 LAB — MRSA PCR SCREENING: MRSA by PCR: NEGATIVE

## 2010-10-18 LAB — D-DIMER, QUANTITATIVE: D-Dimer, Quant: 0.68 ug/mL-FEU — ABNORMAL HIGH (ref 0.00–0.48)

## 2010-10-19 LAB — DIFFERENTIAL
Basophils Absolute: 0 10*3/uL (ref 0.0–0.1)
Basophils Relative: 0 % (ref 0–1)
Eosinophils Absolute: 0.1 10*3/uL (ref 0.0–0.7)
Lymphocytes Relative: 29 % (ref 12–46)
Neutrophils Relative %: 61 % (ref 43–77)

## 2010-10-19 LAB — BASIC METABOLIC PANEL
BUN: 17 mg/dL (ref 6–23)
Calcium: 8.3 mg/dL — ABNORMAL LOW (ref 8.4–10.5)
Chloride: 111 mEq/L (ref 96–112)
Creatinine, Ser: 0.92 mg/dL (ref 0.4–1.2)
GFR calc Af Amer: >60 mL/min (ref >60)
GFR calc non Af Amer: >60 mL/min (ref >60)
Potassium: 4 mEq/L (ref 3.5–5.1)
Sodium: 140 mEq/L (ref 135–145)

## 2010-10-19 LAB — MRSA PCR SCREENING: MRSA by PCR: NEGATIVE

## 2010-10-19 LAB — APTT: aPTT: 31 seconds (ref 24–37)

## 2010-10-19 LAB — HCG, SERUM, QUALITATIVE: Preg, Serum: NEGATIVE

## 2010-10-19 LAB — PROTIME-INR: INR: 1.05 (ref 0.00–1.49)

## 2010-12-20 NOTE — Assessment & Plan Note (Signed)
[facility]                        [facility] OFFICE NOTE   NAME:HEDGECOCKScherry, Laverne                       MRN:          [id number]  DATE:05/02/2010                            DOB:          1961/08/05    Ms. Lori Gay is a 50 year old female who is here today for a followup  visit.  She has the following problems list:  1. Supraventricular tachycardia due to atrioventricular nodal reentry      tachycardia.  2. Tobacco use.  3. Back surgeries.   INTERVAL HISTORY:  I saw Ms. Lori Gay 2 weeks ago for evaluation after  she had an emergency room visit for tachycardia.  At that time, she was  noted to have supraventricular tachycardia which was suggestive of AV  nodal tachycardia with a heart rate of 192.  She converted to a normal  sinus rhythm with adenosine and metoprolol.  During her last visit, I  increased the dose of metoprolol extended release to 50 mg once daily.  She had since then 1 episode that lasted 30 minutes the day after she  saw me.  Her heart rate was 170.  At that time, she was still taking 25  mg daily.  Since she started taking 50 mg daily, she had no prolonged  episode.  She continues to have brief runs of palpitations.   MEDICATIONS:  1. Metoprolol extended release 50 mg once daily.  2. Alprazolam 0.25 mg every 8 hours as needed.  3. Methocarbamol 500 mg every 8 hours as needed for back spasm.   PHYSICAL EXAMINATION:  VITAL SIGNS:  Weight is 143.8 pounds, blood  pressure is 118/77, pulse is 81, oxygen saturation is 97% on room air.  NECK:  Reveals no JVD or carotid bruits.  LUNGS:  Clear to auscultation.  HEART:  Regular rate and rhythm with no gallops or murmurs.  ABDOMEN:  Benign, nontender, nondistended.  EXTREMITIES:  No clubbing, cyanosis, or edema.   IMPRESSION:  Paroxysmal supraventricular tachycardia due to  atrioventricular nodal reentry tachycardia.  We did an echocardiogram  which overall was unremarkable.   She has no evidence of structural heart  disease.  There was normal ejection fraction with no significant  valvular abnormalities.  She continues to have brief runs of  palpitations.  At this time, I discussed management options including an  antiarrhythmic medication versus an ablation procedure.  It seems that  metoprolol is not completely controlling her symptoms.  I favor an  ablation procedure given that she is still young and would like to avoid  the toxicity of antiarrhythmic medications.  This is also the patient's  preferred approach given that she does  not like to take medications on the long-term.  Thus, I will go ahead  and refer her to  Electrophysiology for further evaluation and management.  The patient  can continue to follow up there and she can follow up with me as needed  based on their evaluation.     Lorine Bears, MD  Electronically Signed    MA/MedQ  DD: 05/02/2010  DT: 05/03/2010  Job #: [id number]

## 2010-12-20 NOTE — Letter (Signed)
April 19, 2010    Lori Lefevre, MD  [facility]  [address]  [place], [place] [address]   RE:  Lori Gay, Lori Gay  MRN:  [id number]  /  DOB:  25-Jun-1961   Dear Dr. Particia Nearing:   Thank you for referring Lori Gay for further cardiac evaluation  regarding her previous tachycardia.  This is a pleasant 50 year old  female who started having episodes of tachycardia actually while she was  a teenager.  The episodes were not frequent.  She had one emergency room  visit 5 years ago that required treatment.  However, she did not take  any medications regularly.  She was in her usual health until recently  in August when she presented with fast heartbeats.  The episode lasted  for few hours.  It started suddenly with no warning.  It was associated  with dizziness and lightheadedness, but no syncope.  She had mild  dyspnea, but no chest pain.  Her heart rate was about 170.  She then  went to the emergency room where she was found to have narrow complex  tachycardia.  She was treated with 6 mg of IV adenosine which according  to note slowed it down to 130 beats per minute.  She was then given one  dose of IV metoprolol and subsequently she was in normal sinus rhythm  with a heart rate in the 80s.  The patient was discharged home on  metoprolol extended release 25 mg once daily.  Since that time she had  few episodes since then but they did not last more than few minutes.  Before her emergency room visit, she did have a similar episode about 2  weeks before that, but lasted shorter period of time and she did not  seek medical attention at that time.  The patient otherwise is healthy  other than tobacco use.  She has no chronic medical conditions.  She  denies any excessive alcohol or excessive caffeine intake.  She drinks 2  cups of coffee a day.  She does not have history of thyroid disease.   PAST MEDICAL HISTORY:  1. Supraventricular tachycardia.  2. Tobacco use.  3. Back  surgeries.   MEDICATIONS:  1. Metoprolol extended release 25 mg once daily.  2. Alprazolam 0.25 mg as needed.  3. Methocarbamol 500 mg every 8 hours as needed for back spasm.   ALLERGIES:  PENICILLIN and CLARITIN-D.   SOCIAL HISTORY:  Remarkable for smoking one pack per day for 13 years.  She denies any alcohol or recreational drug use.  The patient works in  loading.   FAMILY HISTORY:  Negative for premature coronary artery disease or  arrhythmia.   PAST SURGICAL HISTORY:  Two back surgeries in 2000.   REVIEW OF SYSTEMS:  Remarkable for palpitations and occasional anxiety.  She does have mild heartburn.  A full review of system was performed and  is otherwise negative.   PHYSICAL EXAMINATION:  GENERAL:  The patient appears to be in her stated  age.  She is in no acute distress.  VITAL SIGNS:  Weight is 142 pounds, blood pressure is 119/82, pulse is  83, oxygen saturation is 97% on room air.  HEENT:  Normocephalic, atraumatic.  NECK:  No JVD or carotid bruits.  There is no thyromegaly.  RESPIRATORY:  Normal respiratory effort with no use of accessory  muscles.  Auscultation reveals normal breath sounds.  CARDIOVASCULAR:  Normal PMI.  Normal S1-S2 with no gallops or murmurs.  ABDOMEN:  Benign, nontender, nondistended.  EXTREMITIES:  No clubbing, cyanosis, or edema.  She has varicose veins  in the lower extremities.  SKIN:  Warm and dry.  MUSCULOSKELETAL:  There is normal muscle strength in the upper and lower  extremities.  PSYCHIATRIC:  She is alert and oriented x3 with normal mood and affect.   An electrocardiogram was performed which showed normal sinus rhythm with  poor R-wave progression in the precordial lead.  Heart rate is 77 beats  per minute.  Her recent laboratory findings from the emergency room were  reviewed.  Electrocardiogram showed narrow complex tachycardia with a  heart rate of 192.  There is pseudo R prime evident in the inferior  leads shortly after the  R-wave consistent with AV nodal reentry  tachycardia (short RP tachycardia).  Her labs were unremarkable except  for elevated white cell count at 17,000.  Thyroid function was normal.   IMPRESSION:  1. Supraventricular tachycardia due to atrioventricular nodal reentry      tachycardia.  The patient has a prolonged history of tachycardia,      but it seems that she had more frequent episodes over the last few      months.  She required one emergency room visit recently for IV      adenosine.  Since she was placed on metoprolol, she had very few      episodes which did not last for a long time.  I had a prolonged      discussion with the patient about the pathophysiology and      management options of supraventricular tachycardia.  First, I would      like to obtain an echocardiogram to rule out any structural heart      disease.  Regarding her management, the first step is to try a beta-      blocker or calcium channel blocker as it is being done.  It seems      like she had improvement with metoprolol.  She is currently on a      small dose and given her a few more episodes she had after that, I      will increase the dose today to 50 mg once daily.  If she fails      metoprolol, then management options include an antiarrhythmic      medication or an ablation procedure.  The patient seems to be in      favor of having a curative procedure given that she is not very      good about taking medications long-term.  2. Tobacco use:  I counseled the patient regarding the importance of      smoking cessation.  She will follow up with me after echocardiogram      to evaluate her response to higher dose metoprolol.   Thank you for allowing me to participate in the care of your patient.    Sincerely,      Lori Bears, MD  Electronically Signed    MA/MedQ  DD: 04/19/2010  DT: 04/20/2010  Job #: [id number]

## 2010-12-20 NOTE — Op Note (Signed)
NAME:  Lori Gay, Lori Gay              ACCOUNT NO.:  [id number]   MEDICAL RECORD NO.:  [id number]          PATIENT TYPE:  AMB   LOCATION:  DSC                          FACILITY:  [facility]   PHYSICIAN:  Sharolyn Douglas, M.D.        DATE OF BIRTH:  1960-11-26   DATE OF PROCEDURE:  03/26/2007  DATE OF DISCHARGE:                               OPERATIVE REPORT   DIAGNOSIS:  Lumbar spondylosis and degenerative disk disease.   PROCEDURES:  1. Bilateral L4-5 facette joint injections.  2. Left L4-5 transforaminal epidural steroid injection.  3. Fluoroscopic imaging used for needle placement of the above      injection.   SURGEON:  Sharolyn Douglas, MD.   ASSISTANT:  None.   ANESTHESIA:  MAC plus local.   COMPLICATIONS:  None.   INDICATIONS:  The patient is a pleasant 50 year old female with  persistent back and left lower extremity pain.  She has failed other  conservative treatment modalities and now presents for the above  injections for further diagnostic and therapeutic purposes.  Risk,  benefits and alternatives were reviewed.  The patient elected to  proceed.   PROCEDURE:  After informed consent, she was taken to the operating room.  She was turned prone.  The back was prepped and draped in the usual  sterile fashion.  She underwent sedation by anesthesia.  Fluoroscopy was  brought into the field and the degenerative L4-5 facette joints were  visualized.  The 22-gauge Quincke spinal needles were advanced into the  joints bilaterally at L4-5.  Injection of 40 mg Depo-Medrol and 1 mL 1%  preservative-free lidocaine carried out into each facette joint.   We then turned our attention to performing a left L4-5 transforaminal  epidural steroid injection.  Using oblique imaging, a 22-gauge 3-1/2-  inch Quincke spinal needle was advanced in a posterior oblique direction  underneath the left L4 pedicle.  Aspiration showed no blood or CSF.  I  injected 1 mL of Omnipaque which showed some epidural spread  as well as  spread along the nerve root.  There was no inner vascular uptake.  We  then injected a solution of 80 mg Depo-Medrol and 3 mL 1% preservative-  free lidocaine.  The patient tolerated the procedure well.  No  apparent complications.  Transferred to recovery in stable condition.  I  reviewed post injection instructions with her.  She was given an out of  all work note for today.  She will return to work at [employer] duty tomorrow  and then back to full work on March 28, 2007.      Sharolyn Douglas, M.D.  Electronically Signed     MC/MEDQ  D:  03/26/2007  T:  03/26/2007  Job:  [id number]

## 2011-03-08 ENCOUNTER — Encounter: Payer: Self-pay | Admitting: Internal Medicine

## 2011-04-20 ENCOUNTER — Encounter: Payer: Self-pay | Admitting: Cardiovascular Disease

## 2011-05-19 LAB — POCT HEMOGLOBIN-HEMACUE: Hemoglobin: 16.7 — ABNORMAL HIGH

## 2011-06-02 ENCOUNTER — Encounter: Payer: Self-pay | Admitting: Internal Medicine

## 2011-06-02 ENCOUNTER — Ambulatory Visit (INDEPENDENT_AMBULATORY_CARE_PROVIDER_SITE_OTHER): Payer: PRIVATE HEALTH INSURANCE | Admitting: Internal Medicine

## 2011-06-02 DIAGNOSIS — I441 Atrioventricular block, second degree: Secondary | ICD-10-CM

## 2011-06-02 DIAGNOSIS — F17200 Nicotine dependence, unspecified, uncomplicated: Secondary | ICD-10-CM

## 2011-06-02 DIAGNOSIS — I442 Atrioventricular block, complete: Secondary | ICD-10-CM

## 2011-06-02 LAB — PACEMAKER DEVICE OBSERVATION
AL AMPLITUDE: 4.1 mv
AL IMPEDENCE PM: 408 Ohm
AL THRESHOLD: 1 v
ATRIAL PACING PM: 4.3
BAMS-0001: 170 {beats}/min
BATTERY VOLTAGE: 3 v
RV LEAD IMPEDENCE PM: 472 Ohm
RV LEAD THRESHOLD: 1 v
VENTRICULAR PACING PM: 99.8

## 2011-06-02 NOTE — Progress Notes (Signed)
The patient presents today for routine electrophysiology followup.  Since last being seen in our clinic, the patient reports doing very well.   She remains active.  She denies any symptoms with her pacemaker at this time.  Today, she denies symptoms of palpitations, chest pain, shortness of breath, orthopnea, PND, lower extremity edema, dizziness, presyncope, syncope, or neurologic sequela.  The patient feels that she is tolerating medications without difficulties and is otherwise without complaint today.   Past Medical History  Diagnosis Date  . DDD (degenerative disc disease)     back surgery x2  . Tobacco user   . Second degree AV block     s/p MDT REVO pacemaker by Dr Graciela Husbands  . AVNRT (AV nodal re-entry tachycardia)     s/p slow pathway ablation by Dr Johney Frame complicated by AV block requiring PPM   Past Surgical History  Procedure Date  . Atrial ablation surgery 10/11    slow pathway; for inducible AVNRT  . Pacemaker insertion 11/11    for AV block post ablation; Medtronic Revo by Dr Graciela Husbands  . Tonsillectomy     Current Outpatient Prescriptions  Medication Sig Dispense Refill  . cetirizine (ZYRTEC) 10 MG tablet Take 10 mg by mouth daily.        . clonazePAM (KLONOPIN) 0.5 MG tablet Take 0.5 mg by mouth 2 (two) times daily as needed.        . methocarbamol (ROBAXIN) 500 MG tablet Take 500 mg by mouth every 8 (eight) hours as needed.          Allergies  Allergen Reactions  . Claritin   . Penicillins     History   Social History  . Marital Status: Married    Spouse Name: N/A    Number of Children: N/A  . Years of Education: N/A   Occupational History  . Not on file.   Social History Main Topics  . Smoking status: Current Everyday Smoker  . Smokeless tobacco: Not on file   Comment: 1 ppd x 15 years   . Alcohol Use: Yes     1-2 glasses of wine/day   . Drug Use: Not on file  . Sexually Active: Not on file   Other Topics Concern  . Not on file   Social History  Narrative   Recently married, lives in [place]; works at [employer].     Family History  Problem Relation Age of Onset  . Diabetes      family hx   Physical Exam: Filed Vitals:   06/02/11 1241  BP: 108/70  Pulse: 90  Resp: 18  Height: 5\' 5"  (1.651 m)  Weight: 144 lb (65.318 kg)    GEN- The patient is well appearing, alert and oriented x 3 today.   Head- normocephalic, atraumatic Eyes-  Sclera clear, conjunctiva pink Ears- hearing intact Oropharynx- clear Neck- supple, no JVP Lymph- no cervical lymphadenopathy Lungs- Clear to ausculation bilaterally, normal work of breathing Chest- R sided pacemaker pocket is well healed Heart- Regular rate and rhythm, no murmurs, rubs or gallops, PMI not laterally displaced GI- soft, NT, ND, + BS Extremities- no clubbing, cyanosis, or edema  Pacemaker interrogation- reviewed in detail today,  See PACEART report  Assessment and Plan:

## 2011-06-02 NOTE — Assessment & Plan Note (Signed)
Cessation advised She will try to cut back but is not ready to quit

## 2011-06-02 NOTE — Patient Instructions (Signed)
Your physician wants you to follow-up in: 12 months. You will receive a reminder letter in the mail two months in advance. If you don't receive a letter, please call our office to schedule the follow-up appointment.  Carelink check on August 31, 2011.

## 2011-06-02 NOTE — Assessment & Plan Note (Signed)
Normal pacemaker function See Pace Art report No changes today  

## 2011-07-21 ENCOUNTER — Other Ambulatory Visit: Payer: Self-pay | Admitting: Obstetrics and Gynecology

## 2011-07-21 DIAGNOSIS — N63 Unspecified lump in unspecified breast: Secondary | ICD-10-CM

## 2011-08-04 ENCOUNTER — Ambulatory Visit
Admission: RE | Admit: 2011-08-04 | Discharge: 2011-08-04 | Disposition: A | Discharge status: Home / Self Care | Payer: PRIVATE HEALTH INSURANCE | Source: Ambulatory Visit | Attending: Obstetrics and Gynecology | Admitting: Obstetrics and Gynecology

## 2011-08-04 DIAGNOSIS — N63 Unspecified lump in unspecified breast: Secondary | ICD-10-CM

## 2011-08-31 ENCOUNTER — Encounter: Payer: PRIVATE HEALTH INSURANCE | Admitting: *Deleted

## 2011-09-04 ENCOUNTER — Encounter: Payer: Self-pay | Admitting: *Deleted

## 2011-09-20 ENCOUNTER — Encounter: Payer: Self-pay | Admitting: Internal Medicine

## 2011-09-20 ENCOUNTER — Telehealth: Payer: Self-pay | Admitting: Internal Medicine

## 2011-09-20 NOTE — Telephone Encounter (Signed)
09-20-11 pt's home # unable to leave message, cell # is work #, will send letter, pt missed January home transmission and needs to send one in/mt

## 2012-01-01 ENCOUNTER — Encounter: Payer: Self-pay | Admitting: *Deleted

## 2012-02-05 ENCOUNTER — Telehealth: Payer: Self-pay | Admitting: Internal Medicine

## 2012-02-05 ENCOUNTER — Encounter: Payer: Self-pay | Admitting: Internal Medicine

## 2012-02-05 ENCOUNTER — Ambulatory Visit (INDEPENDENT_AMBULATORY_CARE_PROVIDER_SITE_OTHER): Payer: PRIVATE HEALTH INSURANCE | Admitting: *Deleted

## 2012-02-05 DIAGNOSIS — I441 Atrioventricular block, second degree: Secondary | ICD-10-CM

## 2012-02-05 NOTE — Telephone Encounter (Signed)
Patient no longer has a home phone but will go to a friend's house to send the transmission and will also call to schedule an appointment with Dr. Johney Frame for October.

## 2012-02-05 NOTE — Telephone Encounter (Signed)
New msg Pt has transmission box but she doesn't have home phone. She wants to know what to do. Please call

## 2012-02-09 LAB — REMOTE PACEMAKER DEVICE
ATRIAL PACING PM: 3.31
BRDY-0003RA: 130 {beats}/min
BRDY-0004RA: 130 {beats}/min
RV LEAD IMPEDENCE PM: 448 Ohm
VENTRICULAR PACING PM: 99.68

## 2012-02-19 ENCOUNTER — Encounter: Payer: Self-pay | Admitting: *Deleted

## 2012-06-13 ENCOUNTER — Encounter: Payer: Self-pay | Admitting: *Deleted

## 2012-06-13 DIAGNOSIS — Z950 Presence of cardiac pacemaker: Secondary | ICD-10-CM | POA: Insufficient documentation

## 2012-06-19 ENCOUNTER — Encounter: Payer: Self-pay | Admitting: Internal Medicine

## 2012-06-19 ENCOUNTER — Ambulatory Visit (INDEPENDENT_AMBULATORY_CARE_PROVIDER_SITE_OTHER): Payer: Commercial Managed Care - PPO | Admitting: Internal Medicine

## 2012-06-19 VITALS — BP 126/66 | HR 104 | Ht 65.0 in | Wt 142.0 lb

## 2012-06-19 DIAGNOSIS — I441 Atrioventricular block, second degree: Secondary | ICD-10-CM

## 2012-06-19 DIAGNOSIS — Z950 Presence of cardiac pacemaker: Secondary | ICD-10-CM

## 2012-06-19 DIAGNOSIS — F17200 Nicotine dependence, unspecified, uncomplicated: Secondary | ICD-10-CM

## 2012-06-19 DIAGNOSIS — I498 Other specified cardiac arrhythmias: Secondary | ICD-10-CM

## 2012-06-19 LAB — PACEMAKER DEVICE OBSERVATION
AL IMPEDENCE PM: 520 Ohm
ATRIAL PACING PM: 3.7
BATTERY VOLTAGE: 3 V
RV LEAD IMPEDENCE PM: 488 Ohm
VENTRICULAR PACING PM: 99.8

## 2012-06-19 NOTE — Patient Instructions (Signed)
Remote monitoring is used to monitor your Pacemaker of ICD from home. This monitoring reduces the number of office visits required to check your device to one time per year. It allows [place] to keep an eye on the functioning of your device to ensure it is working properly. You are scheduled for a device check from home on September 23, 2012. You may send your transmission at any time that day. If you have a wireless device, the transmission will be sent automatically. After your physician reviews your transmission, you will receive a postcard with your next transmission date.  Your physician wants you to follow-up in: 1 year with Dr Johney Frame.  You will receive a reminder letter in the mail two months in advance. If you don't receive a letter, please call our office to schedule the follow-up appointment.

## 2012-06-24 NOTE — Progress Notes (Signed)
The patient presents today for routine electrophysiology followup.  Since last being seen in our clinic, the patient reports doing very well.   She remains active.  She is working full time and spending time with her grandchild.  Today, she denies symptoms of palpitations, chest pain, shortness of breath, lower extremity edema, dizziness, presyncope, syncope, or neurologic sequela.    Past Medical History  Diagnosis Date  . DDD (degenerative disc disease)     back surgery x2  . Tobacco user   . Second degree AV block     s/p MDT REVO pacemaker by Dr Graciela Husbands  . AVNRT (AV nodal re-entry tachycardia)     s/p slow pathway ablation by Dr Johney Frame complicated by AV block requiring PPM   Past Surgical History  Procedure Date  . Atrial ablation surgery 10/11    slow pathway; for inducible AVNRT  . Pacemaker insertion 11/11    for AV block post ablation; Medtronic Revo by Dr Graciela Husbands  . Tonsillectomy     Current Outpatient Prescriptions  Medication Sig Dispense Refill  . cetirizine (ZYRTEC) 10 MG tablet Take 10 mg by mouth daily.        . clonazePAM (KLONOPIN) 0.5 MG tablet Take 0.5 mg by mouth 2 (two) times daily as needed.        . methocarbamol (ROBAXIN) 500 MG tablet Take 500 mg by mouth every 8 (eight) hours as needed.          Allergies  Allergen Reactions  . Loratadine   . Penicillins     History   Social History  . Marital Status: Married    Spouse Name: N/A    Number of Children: N/A  . Years of Education: N/A   Occupational History  . Not on file.   Social History Main Topics  . Smoking status: Current Every Day Smoker  . Smokeless tobacco: Not on file     Comment: 1 ppd x 15 years   . Alcohol Use: Yes     Comment: 1-2 glasses of wine/day   . Drug Use: Not on file  . Sexually Active: Not on file   Other Topics Concern  . Not on file   Social History Narrative   Recently married, lives in [place]; works at [employer].     Family History  Problem  Relation Age of Onset  . Diabetes      family hx   Physical Exam: Filed Vitals:   06/19/12 1413  BP: 126/66  Pulse: 104  Height: 5\' 5"  (1.651 m)  Weight: 142 lb (64.411 kg)  SpO2: 98%    GEN- The patient is well appearing, alert and oriented x 3 today.   Head- normocephalic, atraumatic Eyes-  Sclera clear, conjunctiva pink Ears- hearing intact Oropharynx- clear Neck- supple, no JVP Lymph- no cervical lymphadenopathy Lungs- Clear to ausculation bilaterally, normal work of breathing Chest- R sided pacemaker pocket is well healed Heart- Regular rate and rhythm, no murmurs, rubs or gallops, PMI not laterally displaced GI- soft, NT, ND, + BS Extremities- no clubbing, cyanosis, or edema  Pacemaker interrogation- reviewed in detail today,  See PACEART report  Assessment and Plan:

## 2012-06-24 NOTE — Assessment & Plan Note (Signed)
Normal pacemaker function See Pace Art report No changes today  carelink every 3 months Return in 1year 

## 2012-06-24 NOTE — Assessment & Plan Note (Signed)
Cessation is strongly advised She is not ready to quit 

## 2012-06-24 NOTE — Assessment & Plan Note (Signed)
No symptoms of arrhythmia or SVT

## 2012-09-23 ENCOUNTER — Encounter: Payer: PRIVATE HEALTH INSURANCE | Admitting: *Deleted

## 2012-10-03 ENCOUNTER — Encounter: Payer: Self-pay | Admitting: *Deleted

## 2012-10-28 ENCOUNTER — Encounter: Payer: Self-pay | Admitting: *Deleted

## 2012-12-16 ENCOUNTER — Ambulatory Visit (INDEPENDENT_AMBULATORY_CARE_PROVIDER_SITE_OTHER): Payer: Commercial Managed Care - PPO | Admitting: *Deleted

## 2012-12-16 ENCOUNTER — Encounter: Payer: Self-pay | Admitting: Internal Medicine

## 2012-12-16 DIAGNOSIS — I441 Atrioventricular block, second degree: Secondary | ICD-10-CM

## 2012-12-16 DIAGNOSIS — Z950 Presence of cardiac pacemaker: Secondary | ICD-10-CM

## 2012-12-24 LAB — REMOTE PACEMAKER DEVICE
AL AMPLITUDE: 3.2 mv
BAMS-0001: 170 {beats}/min
RV LEAD IMPEDENCE PM: 456 Ohm

## 2013-01-10 ENCOUNTER — Encounter: Payer: Self-pay | Admitting: *Deleted

## 2013-03-31 ENCOUNTER — Encounter: Payer: PRIVATE HEALTH INSURANCE | Admitting: *Deleted

## 2013-04-03 ENCOUNTER — Encounter: Payer: Self-pay | Admitting: *Deleted

## 2013-04-09 ENCOUNTER — Ambulatory Visit (INDEPENDENT_AMBULATORY_CARE_PROVIDER_SITE_OTHER): Payer: Commercial Managed Care - PPO | Admitting: *Deleted

## 2013-04-09 DIAGNOSIS — I441 Atrioventricular block, second degree: Secondary | ICD-10-CM

## 2013-04-14 ENCOUNTER — Other Ambulatory Visit: Payer: Self-pay | Admitting: *Deleted

## 2013-04-17 LAB — REMOTE PACEMAKER DEVICE
AL AMPLITUDE: 3.5 mv
AL IMPEDENCE PM: 464 Ohm
BATTERY VOLTAGE: 3 V
RV LEAD AMPLITUDE: 20 mv
VENTRICULAR PACING PM: 99.91

## 2013-04-30 ENCOUNTER — Encounter: Payer: Self-pay | Admitting: *Deleted

## 2013-05-09 ENCOUNTER — Encounter: Payer: Self-pay | Admitting: Internal Medicine

## 2013-06-23 ENCOUNTER — Encounter: Payer: Self-pay | Admitting: Internal Medicine

## 2013-06-23 ENCOUNTER — Ambulatory Visit (INDEPENDENT_AMBULATORY_CARE_PROVIDER_SITE_OTHER): Payer: Commercial Managed Care - PPO | Admitting: Internal Medicine

## 2013-06-23 VITALS — BP 110/60 | HR 70 | Ht 65.0 in | Wt 140.0 lb

## 2013-06-23 DIAGNOSIS — I441 Atrioventricular block, second degree: Secondary | ICD-10-CM

## 2013-06-23 DIAGNOSIS — F17200 Nicotine dependence, unspecified, uncomplicated: Secondary | ICD-10-CM

## 2013-06-23 DIAGNOSIS — Z950 Presence of cardiac pacemaker: Secondary | ICD-10-CM

## 2013-06-23 NOTE — Addendum Note (Signed)
Addended by: Dennis Bast F on: 06/23/2013 10:51 AM   Modules accepted: Orders

## 2013-06-23 NOTE — Patient Instructions (Signed)
Remote monitoring is used to monitor your Pacemaker of ICD from home. This monitoring reduces the number of office visits required to check your device to one time per year. It allows [place] to keep an eye on the functioning of your device to ensure it is working properly. You are scheduled for a device check from home on September 24, 2013. You may send your transmission at any time that day. If you have a wireless device, the transmission will be sent automatically. After your physician reviews your transmission, you will receive a postcard with your next transmission date.  Your physician wants you to follow-up in: 1 year with Dr Johney Frame.  You will receive a reminder letter in the mail two months in advance. If you don't receive a letter, please call our office to schedule the follow-up appointment.

## 2013-06-23 NOTE — Progress Notes (Signed)
   Lori Gay is a 52 y.o. female who presents today for routine electrophysiology followup.  Since last being seen in our clinic, the patient reports doing very well.  She has very rare dizziness.  Today, she denies symptoms of palpitations, chest pain, shortness of breath,  lower extremity edema, presyncope, or syncope.  The patient is otherwise without complaint today.   Past Medical History  Diagnosis Date  . DDD (degenerative disc disease)     back surgery x2  . Tobacco user   . Second degree AV block     s/p MDT REVO pacemaker by Dr Graciela Husbands  . AVNRT (AV nodal re-entry tachycardia)     s/p slow pathway ablation by Dr Johney Frame complicated by AV block requiring PPM   Past Surgical History  Procedure Laterality Date  . Atrial ablation surgery  10/11    slow pathway; for inducible AVNRT  . Pacemaker insertion  11/11    for AV block post ablation; Medtronic Revo by Dr Graciela Husbands  . Tonsillectomy      Current Outpatient Prescriptions  Medication Sig Dispense Refill  . cetirizine (ZYRTEC) 10 MG tablet Take 10 mg by mouth daily.        . clonazePAM (KLONOPIN) 0.5 MG tablet Take 0.5 mg by mouth 2 (two) times daily as needed.        . methocarbamol (ROBAXIN) 500 MG tablet Take 500 mg by mouth every 8 (eight) hours as needed.         No current facility-administered medications for this visit.    Physical Exam: Filed Vitals:   06/23/13 1021  BP: 110/60  Pulse: 70  Height: 5\' 5"  (1.651 m)  Weight: 140 lb (63.504 kg)    GEN- The patient is well appearing, alert and oriented x 3 today.   Head- normocephalic, atraumatic Eyes-  Sclera clear, conjunctiva pink Ears- hearing intact Oropharynx- clear Lungs- Clear to ausculation bilaterally, normal work of breathing Chest- pacemaker pocket is well healed Heart- Regular rate and rhythm, no murmurs, rubs or gallops, PMI not laterally displaced GI- soft, NT, ND, + BS Extremities- no clubbing, cyanosis, or edema  Pacemaker interrogation-  reviewed in detail today,  See PACEART report  Assessment and Plan:  1. Mobitz II Normal pacemaker function See Pace Art report No changes today  2. Tobacco abuse She is not ready to quit

## 2013-08-18 ENCOUNTER — Encounter: Payer: Commercial Managed Care - PPO | Admitting: *Deleted

## 2013-08-18 ENCOUNTER — Encounter: Payer: Self-pay | Admitting: Internal Medicine

## 2013-08-18 ENCOUNTER — Ambulatory Visit (HOSPITAL_COMMUNITY): Payer: Commercial Managed Care - PPO | Attending: Internal Medicine | Admitting: Radiology

## 2013-08-18 ENCOUNTER — Ambulatory Visit (INDEPENDENT_AMBULATORY_CARE_PROVIDER_SITE_OTHER): Payer: Commercial Managed Care - PPO | Admitting: Internal Medicine

## 2013-08-18 ENCOUNTER — Other Ambulatory Visit (HOSPITAL_COMMUNITY): Payer: Self-pay | Admitting: Radiology

## 2013-08-18 VITALS — BP 116/81 | HR 86 | Ht 65.0 in | Wt 138.0 lb

## 2013-08-18 DIAGNOSIS — R0789 Other chest pain: Secondary | ICD-10-CM

## 2013-08-18 DIAGNOSIS — I498 Other specified cardiac arrhythmias: Secondary | ICD-10-CM

## 2013-08-18 DIAGNOSIS — F17200 Nicotine dependence, unspecified, uncomplicated: Secondary | ICD-10-CM | POA: Insufficient documentation

## 2013-08-18 DIAGNOSIS — I319 Disease of pericardium, unspecified: Secondary | ICD-10-CM | POA: Insufficient documentation

## 2013-08-18 DIAGNOSIS — I441 Atrioventricular block, second degree: Secondary | ICD-10-CM

## 2013-08-18 LAB — MDC_IDC_ENUM_SESS_TYPE_INCLINIC
Battery Voltage: 2.99 V
Brady Statistic AP VS Percent: 0 %
Brady Statistic RA Percent Paced: 4.07 %
Brady Statistic RV Percent Paced: 99.82 %
Lead Channel Impedance Value: 456 Ohm
Lead Channel Pacing Threshold Amplitude: 0.5 V
Lead Channel Pacing Threshold Pulse Width: 0.4 ms
Lead Channel Pacing Threshold Pulse Width: 0.4 ms
Lead Channel Setting Pacing Amplitude: 2 V
Lead Channel Setting Pacing Amplitude: 2.5 V
Lead Channel Setting Sensing Sensitivity: 0.9 mV
MDC IDC MSMT LEADCHNL RA PACING THRESHOLD AMPLITUDE: 1 V
MDC IDC MSMT LEADCHNL RA SENSING INTR AMPL: 3.5358
MDC IDC MSMT LEADCHNL RV IMPEDANCE VALUE: 520 Ohm
MDC IDC MSMT LEADCHNL RV SENSING INTR AMPL: 20.0408
MDC IDC SESS DTM: 20150112171917
MDC IDC SET LEADCHNL RV PACING PULSEWIDTH: 0.8 ms
MDC IDC STAT BRADY AP VP PERCENT: 4.07 %
MDC IDC STAT BRADY AS VP PERCENT: 95.75 %
MDC IDC STAT BRADY AS VS PERCENT: 0.18 %
Zone Setting Detection Interval: 350 ms
Zone Setting Detection Interval: 400 ms

## 2013-08-18 NOTE — Assessment & Plan Note (Signed)
satble post pacing

## 2013-08-18 NOTE — Assessment & Plan Note (Signed)
Her atypical chest pain and ulcerations are associated with ventricular pacing VA conduction. We were able to reproduce this predictably.sghe ahs 1.5 PVC/hr so not a lot  She has also somewhat fast  Will check Hbg aand TSH

## 2013-08-18 NOTE — Progress Notes (Signed)
Echocardiogram performed.  

## 2013-08-18 NOTE — Patient Instructions (Signed)
Given prescription to get lab draw for: BMET/Mg/CBC/TSH Please have them fax results to [phone], attn. Dr. Graciela HusbandsKlein.

## 2013-08-18 NOTE — Progress Notes (Signed)
      Patient Care Team: Bufford SpikesJennifer S Maynard as PCP - General Hubert AzureJennifer L Grayer, NP as Nurse Practitioner (Nurse Practitioner)   HPI  Rosalyn ChartersLisa Lien is a 53 y.o. female  With complaints of pounding in her chest associated with chest pain. I originally misunderstood that she was recently implanted; sections implanted 3-1/2 years ago. She hasn't similar symptoms shortly after device implantation.  She has exertional discomfort with this. It is also notable when she lies down at night. Past Medical History  Diagnosis Date  . DDD (degenerative disc disease)     back surgery x2  . Tobacco user   . Second degree AV block     s/p MDT REVO pacemaker by Dr Graciela HusbandsKlein  . AVNRT (AV nodal re-entry tachycardia)     s/p slow pathway ablation by Dr Johney FrameAllred complicated by AV block requiring PPM    Past Surgical History  Procedure Laterality Date  . Atrial ablation surgery  10/11    slow pathway; for inducible AVNRT  . Pacemaker insertion  11/11    for AV block post ablation; Medtronic Revo by Dr Graciela HusbandsKlein  . Tonsillectomy      Current Outpatient Prescriptions  Medication Sig Dispense Refill  . cetirizine (ZYRTEC) 10 MG tablet Take 10 mg by mouth as needed.       . clonazePAM (KLONOPIN) 0.5 MG tablet Take 0.5 mg by mouth 2 (two) times daily as needed.         No current facility-administered medications for this visit.    Allergies  Allergen Reactions  . Loratadine     Weak.   Marland Kitchen. Penicillins     unknown    Review of Systems negative except from HPI and PMH  Physical Exam BP 116/81  Pulse 86  Ht 5\' 5"  (1.651 m)  Wt 138 lb (62.596 kg)  BMI 22.96 kg/m2 Well developed and well nourished in no acute distress HENT normal E scleral and icterus clear Neck Supple JVP flat; carotids brisk and full Clear to ausculation  Regular rate and rhythm, no murmurs gallops or rub Soft with active bowel sounds No clubbing cyanosis no Edema Alert and oriented, grossly normal motor and sensory  function Skin Warm and Dry    Assessment and  Plan

## 2013-08-21 ENCOUNTER — Encounter: Payer: Self-pay | Admitting: Internal Medicine

## 2013-09-24 ENCOUNTER — Encounter: Payer: Commercial Managed Care - PPO | Admitting: *Deleted

## 2013-09-29 ENCOUNTER — Encounter: Payer: Self-pay | Admitting: *Deleted

## 2014-01-07 ENCOUNTER — Encounter: Payer: Self-pay | Admitting: Cardiology

## 2014-01-21 ENCOUNTER — Ambulatory Visit (INDEPENDENT_AMBULATORY_CARE_PROVIDER_SITE_OTHER): Payer: Commercial Managed Care - PPO | Admitting: *Deleted

## 2014-01-21 DIAGNOSIS — Z950 Presence of cardiac pacemaker: Secondary | ICD-10-CM

## 2014-01-21 DIAGNOSIS — I441 Atrioventricular block, second degree: Secondary | ICD-10-CM

## 2014-01-22 NOTE — Progress Notes (Signed)
Remote pacemaker transmission.   

## 2014-01-28 LAB — MDC_IDC_ENUM_SESS_TYPE_REMOTE
Battery Voltage: 2.99 V
Brady Statistic AS VP Percent: 93.84 %
Brady Statistic AS VS Percent: 0.21 %
Brady Statistic RA Percent Paced: 5.96 %
Brady Statistic RV Percent Paced: 99.79 %
Date Time Interrogation Session: 20150617210439
Lead Channel Impedance Value: 472 Ohm
Lead Channel Setting Pacing Amplitude: 2.5 V
Lead Channel Setting Sensing Sensitivity: 0.9 mV
MDC IDC MSMT LEADCHNL RA IMPEDANCE VALUE: 432 Ohm
MDC IDC MSMT LEADCHNL RA SENSING INTR AMPL: 3.0184
MDC IDC MSMT LEADCHNL RV SENSING INTR AMPL: 17.7382
MDC IDC SET LEADCHNL RA PACING AMPLITUDE: 2 V
MDC IDC SET LEADCHNL RV PACING PULSEWIDTH: 0.8 ms
MDC IDC STAT BRADY AP VP PERCENT: 5.96 %
MDC IDC STAT BRADY AP VS PERCENT: 0 %
Zone Setting Detection Interval: 350 ms
Zone Setting Detection Interval: 400 ms

## 2014-02-10 ENCOUNTER — Encounter: Payer: Self-pay | Admitting: Cardiology

## 2014-02-17 ENCOUNTER — Encounter: Payer: Self-pay | Admitting: Internal Medicine

## 2014-04-28 ENCOUNTER — Ambulatory Visit (INDEPENDENT_AMBULATORY_CARE_PROVIDER_SITE_OTHER): Payer: Commercial Managed Care - PPO | Admitting: *Deleted

## 2014-04-28 ENCOUNTER — Telehealth: Payer: Self-pay | Admitting: Cardiology

## 2014-04-28 DIAGNOSIS — I498 Other specified cardiac arrhythmias: Secondary | ICD-10-CM

## 2014-04-28 NOTE — Telephone Encounter (Signed)
LMOVM reminding pt to send remote transmission.   

## 2014-04-29 ENCOUNTER — Encounter: Payer: Self-pay | Admitting: Cardiology

## 2014-04-29 DIAGNOSIS — I498 Other specified cardiac arrhythmias: Secondary | ICD-10-CM

## 2014-04-29 LAB — MDC_IDC_ENUM_SESS_TYPE_REMOTE
Brady Statistic AP VS Percent: 0 %
Brady Statistic AS VP Percent: 95.31 %
Brady Statistic AS VS Percent: 0.14 %
Brady Statistic RA Percent Paced: 4.55 %
Brady Statistic RV Percent Paced: 99.86 %
Date Time Interrogation Session: 20150923211020
Lead Channel Impedance Value: 440 Ohm
Lead Channel Impedance Value: 512 Ohm
Lead Channel Setting Pacing Amplitude: 2 V
Lead Channel Setting Pacing Amplitude: 2.5 V
Lead Channel Setting Pacing Pulse Width: 0.8 ms
Lead Channel Setting Sensing Sensitivity: 0.9 mV
MDC IDC MSMT BATTERY VOLTAGE: 2.97 V
MDC IDC MSMT LEADCHNL RA SENSING INTR AMPL: 3 mV
MDC IDC SET ZONE DETECTION INTERVAL: 400 ms
MDC IDC STAT BRADY AP VP PERCENT: 4.55 %
Zone Setting Detection Interval: 350 ms

## 2014-04-30 NOTE — Progress Notes (Signed)
Remote pacemaker transmission.   

## 2014-05-11 ENCOUNTER — Encounter: Payer: Self-pay | Admitting: Cardiology

## 2014-05-15 ENCOUNTER — Encounter: Payer: Self-pay | Admitting: Internal Medicine

## 2014-09-08 ENCOUNTER — Telehealth: Payer: Self-pay | Admitting: Internal Medicine

## 2014-09-08 NOTE — Telephone Encounter (Signed)
09/08/14 left vm to sched Jan recall, and sent 2nd recall; ah

## 2014-09-10 ENCOUNTER — Encounter: Payer: Self-pay | Admitting: *Deleted

## 2014-10-01 ENCOUNTER — Encounter: Payer: Self-pay | Admitting: *Deleted

## 2014-10-13 ENCOUNTER — Encounter: Payer: Self-pay | Admitting: *Deleted

## 2014-11-18 ENCOUNTER — Encounter: Payer: Commercial Managed Care - PPO | Admitting: Internal Medicine

## 2014-11-25 ENCOUNTER — Encounter: Payer: Self-pay | Admitting: Internal Medicine

## 2014-11-25 ENCOUNTER — Ambulatory Visit (INDEPENDENT_AMBULATORY_CARE_PROVIDER_SITE_OTHER): Payer: Commercial Managed Care - PPO | Admitting: Internal Medicine

## 2014-11-25 VITALS — BP 136/80 | HR 86

## 2014-11-25 DIAGNOSIS — I441 Atrioventricular block, second degree: Secondary | ICD-10-CM | POA: Diagnosis not present

## 2014-11-25 DIAGNOSIS — R079 Chest pain, unspecified: Secondary | ICD-10-CM

## 2014-11-25 LAB — MDC_IDC_ENUM_SESS_TYPE_INCLINIC
Battery Voltage: 2.97 V
Brady Statistic AP VS Percent: 0 %
Brady Statistic AS VP Percent: 95.7 %
Brady Statistic AS VS Percent: 0.15 %
Date Time Interrogation Session: 20160420170308
Lead Channel Impedance Value: 424 Ohm
Lead Channel Pacing Threshold Amplitude: 0.5 V
Lead Channel Pacing Threshold Amplitude: 1 V
Lead Channel Pacing Threshold Pulse Width: 0.4 ms
Lead Channel Pacing Threshold Pulse Width: 0.4 ms
Lead Channel Sensing Intrinsic Amplitude: 20 mV
Lead Channel Sensing Intrinsic Amplitude: 3.7083
Lead Channel Setting Pacing Amplitude: 2.5 V
Lead Channel Setting Pacing Pulse Width: 0.8 ms
MDC IDC MSMT LEADCHNL RV IMPEDANCE VALUE: 488 Ohm
MDC IDC SET LEADCHNL RA PACING AMPLITUDE: 2 V
MDC IDC SET LEADCHNL RV SENSING SENSITIVITY: 0.9 mV
MDC IDC SET ZONE DETECTION INTERVAL: 350 ms
MDC IDC SET ZONE DETECTION INTERVAL: 400 ms
MDC IDC STAT BRADY AP VP PERCENT: 4.15 %
MDC IDC STAT BRADY RA PERCENT PACED: 4.15 %
MDC IDC STAT BRADY RV PERCENT PACED: 99.85 %

## 2014-11-25 NOTE — Patient Instructions (Signed)
Medication Instructions:  Your physician recommends that you continue on your current medications as directed. Please refer to the Current Medication list given to you today.   Labwork: BMP/Mag  Testing/Procedures: Your physician has requested that you have a lexiscan myoview. For further information please visit [email or web address]. Please follow instruction sheet, as given.    Follow-Up: Your physician wants you to follow-up in: 12 months with Dr Jacquiline DoeAllred You will receive a reminder letter in the mail two months in advance. If you don't receive a letter, please call our office to schedule the follow-up appointment.  Remote monitoring is used to monitor your Pacemaker or ICD from home. This monitoring reduces the number of office visits required to check your device to one time per year. It allows us to keep an eye on the functioning of your device to ensure it is working properly. You are scheduled for a device check from home on 02/24/15. You may send your transmission at any time that day. If you have a wireless device, the transmission will be sent automatically. After your physician reviews your transmission, you will receive a postcard with your next transmission date.     Any Other Special Instructions Will Be Listed Below (If Applicable).

## 2014-11-25 NOTE — Progress Notes (Signed)
Electrophysiology Office Note   Date:  11/25/2014   ID:  Cecilie Heidel, DOB 1961-02-10, MRN [id number]  PCP:  Bufford Spikes, NP   Primary Electrophysiologist: Hillis Range, MD    Chief Complaint  Patient presents with  . Fatigue     History of Present Illness: Lori Gay is a 54 y.o. female who presents today for electrophysiology evaluation.   She presents today for routine follow-up.  Her palpitations for which she was previously seen by Dr Graciela Husbands have resolved. She attributes this to home related "stress".  She is now separated from spouse and doing "better".  She has fatigue.  This has been present for several months and seemed worse after receiving an epidural steroid injection in February. She also has "panic attacks" which she describes as abrupt anxiety with associated chest pain, neck pain, and SOB.  This occurs when she goes to the dentist, dermatologists, or is really worried.  She typically does not have exertional symptoms.  She denies associated tachypalpitations.  PPM interrogation today does not reveal any arrhythmias to explain her symptoms.  She has been having leg cramps at night recently.   Today, she denies symptoms of orthopnea, PND, lower extremity edema, claudication, dizziness, presyncope, syncope, bleeding, or neurologic sequela. The patient is tolerating medications without difficulties and is otherwise without complaint today.    Past Medical History  Diagnosis Date  . DDD (degenerative disc disease)     back surgery x2  . Tobacco user   . Second degree AV block     s/p MDT REVO pacemaker by Dr Graciela Husbands  . AVNRT (AV nodal re-entry tachycardia)     s/p slow pathway ablation by Dr Johney Frame complicated by AV block requiring PPM   Past Surgical History  Procedure Laterality Date  . Atrial ablation surgery  10/11    slow pathway; for inducible AVNRT  . Pacemaker insertion  11/11    for AV block post ablation; Medtronic Revo by Dr Graciela Husbands  .  Tonsillectomy       Current Outpatient Prescriptions  Medication Sig Dispense Refill  . cetirizine (ZYRTEC) 10 MG tablet Take 10 mg by mouth daily as needed for allergies or rhinitis.     . clonazePAM (KLONOPIN) 0.5 MG tablet Take 0.5 mg by mouth 2 (two) times daily as needed for anxiety.      No current facility-administered medications for this visit.    Allergies:   Loratadine and Penicillins   Social History:  The patient  reports that she has been smoking.  She does not have any smokeless tobacco history on file. She reports that she drinks alcohol.   Family History:  The patient's family history includes Diabetes in her mother and another family member; Healthy in her brother; Hypertension in her father and mother.    ROS:  Please see the history of present illness.   All other systems are reviewed and negative.    PHYSICAL EXAM: VS:  BP 136/80 mmHg  Pulse 86  SpO2 99% , BMI There is no weight on file to calculate BMI. GEN: Well nourished, well developed, in no acute distress HEENT: normal Neck: no JVD, carotid bruits, or masses Cardiac: RRR; no murmurs, rubs, or gallops,no edema  Respiratory:  clear to auscultation bilaterally, normal work of breathing GI: soft, nontender, nondistended, + BS MS: no deformity or atrophy Skin: warm and dry, device pocket is well healed Neuro:  Strength and sensation are intact Psych: euthymic mood, full affect  Device  interrogation is reviewed today in detail.  See PaceArt for details.   Recent Labs: No results found for requested labs within last 365 days.    Lipid Panel  No results found for: CHOL, TRIG, HDL, CHOLHDL, VLDL, LDLCALC, LDLDIRECT   Wt Readings from Last 3 Encounters:  08/18/13 138 lb (62.596 kg)  06/23/13 140 lb (63.504 kg)  06/19/12 142 lb (64.411 kg)     ASSESSMENT AND PLAN:  1.  Panic episodes with associated CP and SOB I worry that these episodes could be due to ischemia Will obtain lexiscan  myoview Cannot have ETT due to paced rhythm  2. Second degree AV block Normal pacemaker function See Pace Art report No changes today  3. Fatigue Unclear etiology She declines sleep study Rule out ischemia as above bmet today Follow-up with PCP  4. Leg cramps Bmet, mg today Adequate hydration is encouraged   Current medicines are reviewed at length with the patient today.   The patient does not have concerns regarding her medicines.  The following changes were made today:  none  Labs/ tests ordered today include:  Orders Placed This Encounter  Procedures  . Basic metabolic panel  . CBC with Differential  . Myocardial Perfusion Imaging  . Implantable device check    Follow-up: My Carelink Smart box is given to her today, return to see me in 1 year  Signed, Hillis RangeJames Tassie Pollett, MD  11/25/2014 6:18 PM     [facility] [address] [place] [address] [phone] (office) [phone] (fax)

## 2014-11-26 LAB — CBC WITH DIFFERENTIAL/PLATELET
BASOS ABS: 0 10*3/uL (ref 0.0–0.1)
Basophils Relative: 0.4 % (ref 0.0–3.0)
EOS ABS: 0.1 10*3/uL (ref 0.0–0.7)
Eosinophils Relative: 1.3 % (ref 0.0–5.0)
HEMATOCRIT: 43.3 % (ref 36.0–46.0)
HEMOGLOBIN: 14.9 g/dL (ref 12.0–15.0)
Lymphocytes Relative: 29.9 % (ref 12.0–46.0)
Lymphs Abs: 2.5 10*3/uL (ref 0.7–4.0)
MCHC: 34.4 g/dL (ref 30.0–36.0)
MCV: 98.1 fl (ref 78.0–100.0)
Monocytes Absolute: 0.5 10*3/uL (ref 0.1–1.0)
Monocytes Relative: 5.6 % (ref 3.0–12.0)
NEUTROS ABS: 5.3 10*3/uL (ref 1.4–7.7)
Neutrophils Relative %: 62.8 % (ref 43.0–77.0)
Platelets: 188 10*3/uL (ref 150.0–400.0)
RBC: 4.42 Mil/uL (ref 3.87–5.11)
RDW: 12.8 % (ref 11.5–15.5)
WBC: 8.4 10*3/uL (ref 4.0–10.5)

## 2014-11-26 LAB — BASIC METABOLIC PANEL
BUN: 18 mg/dL (ref 6–23)
CHLORIDE: 105 meq/L (ref 96–112)
CO2: 26 mEq/L (ref 19–32)
CREATININE: 0.9 mg/dL (ref 0.40–1.20)
Calcium: 9.3 mg/dL (ref 8.4–10.5)
GFR: 69.63 mL/min (ref >60.00)
Glucose, Bld: 94 mg/dL (ref 70–99)
POTASSIUM: 3.9 meq/L (ref 3.5–5.1)
Sodium: 140 mEq/L (ref 135–145)

## 2014-11-30 ENCOUNTER — Encounter: Payer: Self-pay | Admitting: Internal Medicine

## 2014-12-07 ENCOUNTER — Ambulatory Visit: Payer: Commercial Managed Care - PPO | Admitting: Internal Medicine

## 2014-12-24 ENCOUNTER — Telehealth (HOSPITAL_COMMUNITY): Payer: Self-pay

## 2014-12-24 NOTE — Telephone Encounter (Signed)
Patient given detailed instructions per Myocardial Perfusion Study Information Sheet for test on 12-25-2014 at 7:30am. Patient verbalized understanding. Randa EvensEdwards, Asyria Kolander A

## 2014-12-25 ENCOUNTER — Ambulatory Visit (HOSPITAL_COMMUNITY): Payer: Commercial Managed Care - PPO | Attending: Cardiology

## 2014-12-25 DIAGNOSIS — R9439 Abnormal result of other cardiovascular function study: Secondary | ICD-10-CM | POA: Insufficient documentation

## 2014-12-25 DIAGNOSIS — R0602 Shortness of breath: Secondary | ICD-10-CM | POA: Insufficient documentation

## 2014-12-25 DIAGNOSIS — R079 Chest pain, unspecified: Secondary | ICD-10-CM | POA: Diagnosis not present

## 2014-12-25 DIAGNOSIS — R5383 Other fatigue: Secondary | ICD-10-CM | POA: Insufficient documentation

## 2014-12-25 LAB — MYOCARDIAL PERFUSION IMAGING
CHL CUP NUCLEAR SSS: 12
CHL CUP STRESS STAGE 1 HR: 60 {beats}/min
CHL CUP STRESS STAGE 2 GRADE: 0 %
CHL CUP STRESS STAGE 2 SPEED: 0 mph
CHL CUP STRESS STAGE 3 HR: 111 {beats}/min
CHL CUP STRESS STAGE 3 SBP: 106 mmHg
CHL CUP STRESS STAGE 4 HR: 105 {beats}/min
CHL CUP STRESS STAGE 5 SPEED: 0 mph
CHL CUP STRESS STAGE 6 DBP: 59 mmHg
CHL CUP STRESS STAGE 6 GRADE: 0 %
CSEPEW: 1 METS
CSEPPHR: 105 {beats}/min
LV dias vol: 86 mL
LV sys vol: 34 mL
Nuc Stress EF: 60 %
Percent of predicted max HR: 62 %
RATE: 0.33
Rest HR: 65 {beats}/min
SDS: 5
SRS: 7
Stage 1 DBP: 65 mmHg
Stage 1 Grade: 0 %
Stage 1 SBP: 116 mmHg
Stage 1 Speed: 0 mph
Stage 2 HR: 60 {beats}/min
Stage 3 DBP: 55 mmHg
Stage 3 Grade: 0 %
Stage 3 Speed: 0 mph
Stage 4 Grade: 0 %
Stage 4 Speed: 0 mph
Stage 5 DBP: 60 mmHg
Stage 5 Grade: 0 %
Stage 5 HR: 90 {beats}/min
Stage 5 SBP: 105 mmHg
Stage 6 HR: 91 {beats}/min
Stage 6 SBP: 104 mmHg
Stage 6 Speed: 0 mph
TID: 0.98

## 2014-12-25 MED ORDER — TECHNETIUM TC 99M SESTAMIBI GENERIC - CARDIOLITE
11.0000 | Freq: Once | INTRAVENOUS | Status: AC | PRN
  Administered 2014-12-25: 11 via INTRAVENOUS

## 2014-12-25 MED ORDER — REGADENOSON 0.4 MG/5ML IV SOLN
0.4000 mg | Freq: Once | INTRAVENOUS | Status: AC
  Administered 2014-12-25: 0.4 mg via INTRAVENOUS

## 2014-12-25 MED ORDER — TECHNETIUM TC 99M SESTAMIBI GENERIC - CARDIOLITE
33.0000 | Freq: Once | INTRAVENOUS | Status: AC | PRN
  Administered 2014-12-25: 33 via INTRAVENOUS

## 2015-02-24 ENCOUNTER — Encounter: Payer: Commercial Managed Care - PPO | Admitting: *Deleted

## 2015-02-24 ENCOUNTER — Telehealth: Payer: Self-pay | Admitting: Cardiology

## 2015-02-24 NOTE — Telephone Encounter (Signed)
LMOVM reminding pt to send remote transmission.   

## 2015-02-25 ENCOUNTER — Encounter: Payer: Self-pay | Admitting: Cardiology

## 2015-03-10 ENCOUNTER — Ambulatory Visit (INDEPENDENT_AMBULATORY_CARE_PROVIDER_SITE_OTHER): Payer: Commercial Managed Care - PPO | Admitting: *Deleted

## 2015-03-10 DIAGNOSIS — I441 Atrioventricular block, second degree: Secondary | ICD-10-CM | POA: Diagnosis not present

## 2015-03-11 NOTE — Progress Notes (Signed)
Remote pacemaker transmission.   

## 2015-03-18 LAB — CUP PACEART REMOTE DEVICE CHECK
Battery Voltage: 2.96 V
Brady Statistic AP VS Percent: 0 %
Brady Statistic AS VP Percent: 95.83 %
Brady Statistic RA Percent Paced: 4.12 %
Date Time Interrogation Session: 20160803215325
Lead Channel Sensing Intrinsic Amplitude: 3.148 mV
Lead Channel Setting Sensing Sensitivity: 0.9 mV
MDC IDC MSMT LEADCHNL RA IMPEDANCE VALUE: 440 Ohm
MDC IDC MSMT LEADCHNL RV IMPEDANCE VALUE: 488 Ohm
MDC IDC SET LEADCHNL RA PACING AMPLITUDE: 2 V
MDC IDC SET LEADCHNL RV PACING AMPLITUDE: 2.5 V
MDC IDC SET LEADCHNL RV PACING PULSEWIDTH: 0.8 ms
MDC IDC STAT BRADY AP VP PERCENT: 4.12 %
MDC IDC STAT BRADY AS VS PERCENT: 0.05 %
MDC IDC STAT BRADY RV PERCENT PACED: 99.95 %
Zone Setting Detection Interval: 350 ms
Zone Setting Detection Interval: 400 ms

## 2015-03-22 ENCOUNTER — Encounter: Payer: Self-pay | Admitting: Cardiology

## 2015-03-24 ENCOUNTER — Encounter: Payer: Self-pay | Admitting: Internal Medicine

## 2015-06-10 ENCOUNTER — Telehealth: Payer: Self-pay | Admitting: Cardiology

## 2015-06-10 ENCOUNTER — Encounter: Payer: Commercial Managed Care - PPO | Admitting: *Deleted

## 2015-06-10 NOTE — Telephone Encounter (Signed)
LMOVM reminding pt to send remote transmission.   

## 2015-06-11 ENCOUNTER — Encounter: Payer: Self-pay | Admitting: Cardiology

## 2015-08-13 IMAGING — NM NM MISC PROCEDURE
8 series · 48 of 48 positions shown · non-contrast
Comparison: none

[Series 1: stress-sum-em · 6.40mm/px · 6 of 64 frames shown]
[frame 6/64]
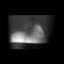
[frame 16/64]
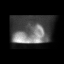
[frame 27/64]
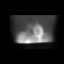
[frame 38/64]
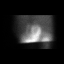
[frame 48/64]
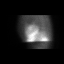
[frame 59/64]
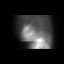

[Series 1: wbr_r-card_st rest · 6.4mm · 6.40mm/px · 6 of 24 frames shown (1 of 2)]
[frame 3/24]
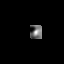
[frame 7/24]
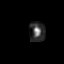
[frame 11/24]
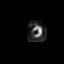
[frame 15/24]
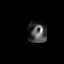
[frame 19/24]
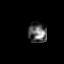
[frame 23/24]
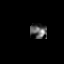

[Series 1: rest · 6.40mm/px · 6 of 64 frames shown (1 of 2)]
[frame 6/64]
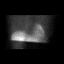
[frame 16/64]
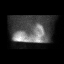
[frame 27/64]
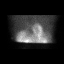
[frame 38/64]
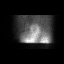
[frame 48/64]
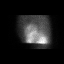
[frame 59/64]
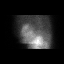

[Series 1: stress-gsp · 6.40mm/px · 6 of 510 frames shown]
[frame 43/510]
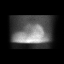
[frame 128/510]
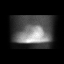
[frame 213/510]
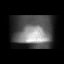
[frame 298/510]
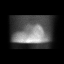
[frame 383/510]
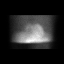
[frame 468/510]
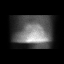

[Series 1: rest · 6.40mm/px · 6 of 64 frames shown (2 of 2)]
[frame 6/64]
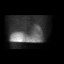
[frame 16/64]
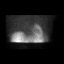
[frame 27/64]
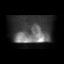
[frame 38/64]
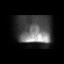
[frame 48/64]
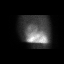
[frame 59/64]
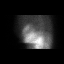

[Series 1: wbr_s-card_st stress-gsp · 6.4mm · 6.40mm/px · 6 of 206 frames shown]
[frame 18/206]
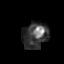
[frame 52/206]
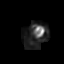
[frame 86/206]
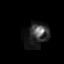
[frame 121/206]
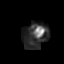
[frame 155/206]
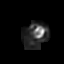
[frame 189/206]
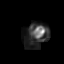

[Series 1: wbr_s-card_st stress-sum-em · 6.4mm · 6.40mm/px · 6 of 26 frames shown]
[frame 3/26]
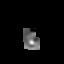
[frame 7/26]
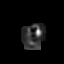
[frame 11/26]
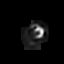
[frame 16/26]
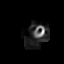
[frame 20/26]
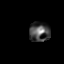
[frame 24/26]
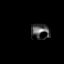

[Series 1: wbr_r-card_st rest · 6.4mm · 6.40mm/px · 6 of 24 frames shown (2 of 2)]
[frame 3/24]
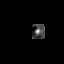
[frame 7/24]
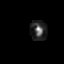
[frame 11/24]
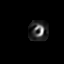
[frame 15/24]
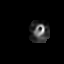
[frame 19/24]
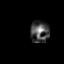
[frame 23/24]
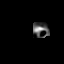

[48 of 48 positions shown; findings below may reference images not displayed]

Canned report from images found in remote index.

Refer to host system for actual result text.

## 2016-01-10 ENCOUNTER — Encounter: Payer: Self-pay | Admitting: Internal Medicine

## 2016-01-10 ENCOUNTER — Ambulatory Visit (INDEPENDENT_AMBULATORY_CARE_PROVIDER_SITE_OTHER): Payer: Commercial Managed Care - PPO | Admitting: Internal Medicine

## 2016-01-10 VITALS — BP 134/72 | HR 68 | Ht 65.0 in | Wt 160.2 lb

## 2016-01-10 DIAGNOSIS — I441 Atrioventricular block, second degree: Secondary | ICD-10-CM

## 2016-01-10 DIAGNOSIS — F17200 Nicotine dependence, unspecified, uncomplicated: Secondary | ICD-10-CM | POA: Diagnosis not present

## 2016-01-10 NOTE — Progress Notes (Signed)
Electrophysiology Office Note   Date:  01/10/2016   ID:  Lori Gay, DOB 06-08-1961, MRN [id number]  PCP:  Bufford SpikesMaynard, Jennifer S, NP   Primary Electrophysiologist: Hillis RangeJames Ramie Erman, MD    Chief Complaint  Patient presents with  . 2nd degree AV block     History of Present Illness: Lori Gay is a 55 y.o. female who presents today for electrophysiology evaluation.   She presents today for routine follow-up.  Her cramps are resolved.  She denies panic attacks or stress episodes this past year.  Active.  She has quit smoking since June of last year but has gained weight since.  Today, she denies symptoms of CP, SOB, orthopnea, PND, lower extremity edema, claudication, dizziness, presyncope, syncope, bleeding, or neurologic sequela. The patient is tolerating medications without difficulties and is otherwise without complaint today.    Past Medical History  Diagnosis Date  . DDD (degenerative disc disease)     back surgery x2  . Tobacco user   . Second degree AV block     s/p MDT REVO pacemaker by Dr Graciela HusbandsKlein  . AVNRT (AV nodal re-entry tachycardia) (HCC)     s/p slow pathway ablation by Dr Johney FrameAllred complicated by AV block requiring PPM   Past Surgical History  Procedure Laterality Date  . Atrial ablation surgery  10/11    slow pathway; for inducible AVNRT  . Pacemaker insertion  11/11    for AV block post ablation; Medtronic Revo by Dr Graciela HusbandsKlein  . Tonsillectomy       Current Outpatient Prescriptions  Medication Sig Dispense Refill  . cetirizine (ZYRTEC) 10 MG tablet Take 10 mg by mouth daily as needed for allergies or rhinitis.     . clonazePAM (KLONOPIN) 0.5 MG tablet Take 0.5 mg by mouth 2 (two) times daily as needed for anxiety.     . NON FORMULARY Take 1 capsule by mouth daily as needed (vitamin supplement). Multi-B    . Probiotic Product (PROBIOTIC PO) Take 1 capsule by mouth daily as needed (digestive support).    . TURMERIC PO Take 1 capsule by mouth once or twice daily       No current facility-administered medications for this visit.    Allergies:   Loratadine and Penicillins   Social History:  The patient  reports that she has been smoking.  She does not have any smokeless tobacco history on file. She reports that she drinks alcohol.   Family History:  The patient's family history includes Diabetes in her mother; Healthy in her brother; Hypertension in her father and mother.    ROS:  Please see the history of present illness.   All other systems are reviewed and negative.    PHYSICAL EXAM: VS:  BP 134/72 mmHg  Pulse 68  Ht 5\' 5"  (1.651 m)  Wt 160 lb 3.2 oz (72.666 kg)  BMI 26.66 kg/m2 , BMI Body mass index is 26.66 kg/(m^2). GEN: Well nourished, well developed, in no acute distress HEENT: normal Neck: no JVD, carotid bruits, or masses Cardiac: RRR; no murmurs, rubs, or gallops,no edema  Respiratory:  clear to auscultation bilaterally, normal work of breathing GI: soft, nontender, nondistended, + BS MS: no deformity or atrophy Skin: warm and dry, device pocket is well healed, there is a small area of redness over her device which looks like sun damage rather than infection.  I have encouraged her to discuss this with dermatology but would avoid biopsy/ aggressive derm procedures which carry risks of device  pocket infection.  L leg with vericose veins. She has seen [place] vein specialist previously Neuro:  Strength and sensation are intact Psych: euthymic mood, full affect  Device interrogation is reviewed today in detail.  See PaceArt for details.   Recent Labs: No results found for requested labs within last 365 days.    Lipid Panel  No results found for: CHOL, TRIG, HDL, CHOLHDL, VLDL, LDLCALC, LDLDIRECT   Wt Readings from Last 3 Encounters:  01/10/16 160 lb 3.2 oz (72.666 kg)  12/25/14 134 lb (60.782 kg)  08/18/13 138 lb (62.596 kg)     ASSESSMENT AND PLAN:  1.  Panic episodes with associated CP and SOB Resolved Low risk  myoview reviewed with patient No further workup  2. Second degree AV block Normal pacemaker function See Pace Art report No changes today  3. Fatigue Unclear etiology She declines sleep study Follow-up with PCP  4. Leg cramps resolved Adequate hydration is encouraged  5. Tobacco I am very pleased that she has quit smoking Regular exercise and diet modification encouraged to combat weight gain   Current medicines are reviewed at length with the patient today.   The patient does not have concerns regarding her medicines.  The following changes were made today:  none  Labs/ tests ordered today include:  No orders of the defined types were placed in this encounter.    Follow-up: carelink, return to see me in 1 year  Signed, Hillis Range, MD  01/10/2016 10:07 AM     [facility] [address] [place] [address] [phone] (office) [phone] (fax)

## 2016-01-10 NOTE — Patient Instructions (Signed)
Medication Instructions:  Your physician recommends that you continue on your current medications as directed. Please refer to the Current Medication list given to you today.   Labwork: None ordered   Testing/Procedures: None ordered   Follow-Up: Your physician wants you to follow-up in: 12 months with Dr Johney FrameAllred Bonita QuinYou will receive a reminder letter in the mail two months in advance. If you don't receive a letter, please call our office to schedule the follow-up appointment.  Remote monitoring is used to monitor your Pacemaker from home. This monitoring reduces the number of office visits required to check your device to one time per year. It allows us to keep an eye on the functioning of your device to ensure it is working properly. You are scheduled for a device check from home on 04/11/16. You may send your transmission at any time that day. If you have a wireless device, the transmission will be sent automatically. After your physician reviews your transmission, you will receive a postcard with your next transmission date.  Fax labs to [phone]    Any Other Special Instructions Will Be Listed Below (If Applicable).     If you need a refill on your cardiac medications before your next appointment, please call your pharmacy.

## 2016-04-11 ENCOUNTER — Encounter: Payer: Commercial Managed Care - PPO | Admitting: *Deleted

## 2016-04-11 ENCOUNTER — Telehealth: Payer: Self-pay | Admitting: Cardiology

## 2016-04-11 NOTE — Telephone Encounter (Signed)
LMOVM reminding pt to send remote transmission.   

## 2016-04-14 ENCOUNTER — Encounter: Payer: Self-pay | Admitting: Cardiology

## 2016-04-19 ENCOUNTER — Ambulatory Visit (INDEPENDENT_AMBULATORY_CARE_PROVIDER_SITE_OTHER): Payer: Commercial Managed Care - PPO | Admitting: *Deleted

## 2016-04-19 DIAGNOSIS — I441 Atrioventricular block, second degree: Secondary | ICD-10-CM | POA: Diagnosis not present

## 2016-04-20 ENCOUNTER — Encounter: Payer: Self-pay | Admitting: Cardiology

## 2016-04-20 NOTE — Progress Notes (Signed)
Remote pacemaker transmission.   

## 2016-04-27 LAB — CUP PACEART REMOTE DEVICE CHECK
Battery Voltage: 2.93 V
Brady Statistic AP VS Percent: 0 %
Brady Statistic AS VP Percent: 94.71 %
Brady Statistic AS VS Percent: 0.08 %
Implantable Lead Implant Date: 20111031
Implantable Lead Implant Date: 20111031
Implantable Lead Location: 753860
Lead Channel Impedance Value: 400 Ohm
Lead Channel Sensing Intrinsic Amplitude: 2.717 mV
Lead Channel Setting Pacing Amplitude: 2 V
Lead Channel Setting Pacing Amplitude: 2.5 V
Lead Channel Setting Pacing Pulse Width: 0.8 ms
MDC IDC LEAD LOCATION: 753859
MDC IDC LEAD MODEL: 5086
MDC IDC LEAD MODEL: 5086
MDC IDC MSMT LEADCHNL RV IMPEDANCE VALUE: 504 Ohm
MDC IDC SESS DTM: 20170913224232
MDC IDC SET LEADCHNL RV SENSING SENSITIVITY: 0.9 mV
MDC IDC STAT BRADY AP VP PERCENT: 5.21 %
MDC IDC STAT BRADY RA PERCENT PACED: 5.21 %
MDC IDC STAT BRADY RV PERCENT PACED: 99.92 %

## 2016-07-19 ENCOUNTER — Encounter: Payer: Commercial Managed Care - PPO | Admitting: *Deleted

## 2016-07-19 ENCOUNTER — Telehealth: Payer: Self-pay | Admitting: Cardiology

## 2016-07-19 NOTE — Telephone Encounter (Signed)
LMOVM reminding pt to send remote transmission.   

## 2016-07-21 ENCOUNTER — Encounter: Payer: Self-pay | Admitting: Cardiology

## 2016-08-01 ENCOUNTER — Ambulatory Visit (INDEPENDENT_AMBULATORY_CARE_PROVIDER_SITE_OTHER): Payer: Commercial Managed Care - PPO | Admitting: *Deleted

## 2016-08-01 DIAGNOSIS — I441 Atrioventricular block, second degree: Secondary | ICD-10-CM

## 2016-08-02 NOTE — Progress Notes (Signed)
Remote pacemaker transmission.   

## 2016-08-03 LAB — CUP PACEART REMOTE DEVICE CHECK
Battery Voltage: 2.92 V
Brady Statistic AS VP Percent: 93.16 %
Brady Statistic AS VS Percent: 0.09 %
Brady Statistic RA Percent Paced: 6.75 %
Date Time Interrogation Session: 20171226155330
Implantable Lead Implant Date: 20111031
Implantable Lead Implant Date: 20111031
Implantable Lead Location: 753859
Implantable Lead Location: 753860
Implantable Pulse Generator Implant Date: 20111031
Lead Channel Impedance Value: 432 Ohm
Lead Channel Sensing Intrinsic Amplitude: 2.889 mV
Lead Channel Setting Pacing Amplitude: 2.5 V
Lead Channel Setting Sensing Sensitivity: 0.9 mV
MDC IDC LEAD MODEL: 5086
MDC IDC LEAD MODEL: 5086
MDC IDC MSMT LEADCHNL RV IMPEDANCE VALUE: 512 Ohm
MDC IDC SET LEADCHNL RA PACING AMPLITUDE: 2 V
MDC IDC SET LEADCHNL RV PACING PULSEWIDTH: 0.8 ms
MDC IDC STAT BRADY AP VP PERCENT: 6.75 %
MDC IDC STAT BRADY AP VS PERCENT: 0 %
MDC IDC STAT BRADY RV PERCENT PACED: 99.9 %

## 2016-08-04 ENCOUNTER — Encounter: Payer: Self-pay | Admitting: Cardiology

## 2016-10-31 ENCOUNTER — Encounter: Payer: Commercial Managed Care - PPO | Admitting: *Deleted

## 2016-10-31 ENCOUNTER — Telehealth: Payer: Self-pay | Admitting: Cardiology

## 2016-10-31 NOTE — Telephone Encounter (Signed)
LMOVM reminding pt to send remote transmission.   

## 2016-11-03 ENCOUNTER — Encounter: Payer: Self-pay | Admitting: Cardiology

## 2016-11-13 ENCOUNTER — Ambulatory Visit (INDEPENDENT_AMBULATORY_CARE_PROVIDER_SITE_OTHER): Payer: Commercial Managed Care - PPO | Admitting: *Deleted

## 2016-11-13 DIAGNOSIS — I441 Atrioventricular block, second degree: Secondary | ICD-10-CM

## 2016-11-14 NOTE — Progress Notes (Signed)
Remote pacemaker transmission.   

## 2016-11-15 ENCOUNTER — Encounter: Payer: Self-pay | Admitting: Cardiology

## 2016-11-15 NOTE — Progress Notes (Signed)
Letter  

## 2016-11-16 LAB — CUP PACEART REMOTE DEVICE CHECK
Brady Statistic AP VS Percent: 0 %
Brady Statistic AS VP Percent: 92.94 %
Brady Statistic RA Percent Paced: 6.97 %
Date Time Interrogation Session: 20180409020106
Implantable Lead Implant Date: 20111031
Implantable Lead Location: 753859
Implantable Lead Location: 753860
Implantable Pulse Generator Implant Date: 20111031
Lead Channel Sensing Intrinsic Amplitude: 2.846 mV
Lead Channel Setting Pacing Amplitude: 2.5 V
Lead Channel Setting Pacing Pulse Width: 0.8 ms
MDC IDC LEAD IMPLANT DT: 20111031
MDC IDC MSMT BATTERY VOLTAGE: 2.92 V
MDC IDC MSMT LEADCHNL RA IMPEDANCE VALUE: 424 Ohm
MDC IDC MSMT LEADCHNL RV IMPEDANCE VALUE: 536 Ohm
MDC IDC SET LEADCHNL RA PACING AMPLITUDE: 2 V
MDC IDC SET LEADCHNL RV SENSING SENSITIVITY: 0.9 mV
MDC IDC STAT BRADY AP VP PERCENT: 6.97 %
MDC IDC STAT BRADY AS VS PERCENT: 0.09 %
MDC IDC STAT BRADY RV PERCENT PACED: 99.9 %

## 2017-01-31 ENCOUNTER — Encounter: Payer: Commercial Managed Care - PPO | Admitting: Internal Medicine

## 2017-02-12 ENCOUNTER — Telehealth: Payer: Self-pay | Admitting: Cardiology

## 2017-02-12 ENCOUNTER — Ambulatory Visit (INDEPENDENT_AMBULATORY_CARE_PROVIDER_SITE_OTHER): Payer: Commercial Managed Care - PPO | Admitting: *Deleted

## 2017-02-12 DIAGNOSIS — I441 Atrioventricular block, second degree: Secondary | ICD-10-CM | POA: Diagnosis not present

## 2017-02-12 NOTE — Telephone Encounter (Signed)
LMOVM reminding pt to send remote transmission.   

## 2017-02-13 LAB — CUP PACEART REMOTE DEVICE CHECK
Battery Voltage: 2.9 V
Brady Statistic AP VS Percent: 0 %
Brady Statistic AS VS Percent: 0.04 %
Date Time Interrogation Session: 20180710013159
Implantable Lead Implant Date: 20111031
Implantable Lead Location: 753859
Implantable Lead Location: 753860
Lead Channel Impedance Value: 432 Ohm
Lead Channel Sensing Intrinsic Amplitude: 2.932 mV
Lead Channel Sensing Intrinsic Amplitude: 20.041 mV
Lead Channel Setting Pacing Amplitude: 2 V
MDC IDC LEAD IMPLANT DT: 20111031
MDC IDC MSMT LEADCHNL RV IMPEDANCE VALUE: 536 Ohm
MDC IDC PG IMPLANT DT: 20111031
MDC IDC SET LEADCHNL RV PACING AMPLITUDE: 2.5 V
MDC IDC SET LEADCHNL RV PACING PULSEWIDTH: 0.8 ms
MDC IDC SET LEADCHNL RV SENSING SENSITIVITY: 0.9 mV
MDC IDC STAT BRADY AP VP PERCENT: 7.12 %
MDC IDC STAT BRADY AS VP PERCENT: 92.84 %
MDC IDC STAT BRADY RA PERCENT PACED: 7.12 %
MDC IDC STAT BRADY RV PERCENT PACED: 99.96 %

## 2017-02-13 NOTE — Progress Notes (Signed)
Remote pacemaker transmission.   

## 2017-02-19 ENCOUNTER — Encounter: Payer: Self-pay | Admitting: Cardiology

## 2017-04-19 ENCOUNTER — Ambulatory Visit (INDEPENDENT_AMBULATORY_CARE_PROVIDER_SITE_OTHER): Payer: Commercial Managed Care - PPO | Admitting: Internal Medicine

## 2017-04-19 ENCOUNTER — Encounter: Payer: Self-pay | Admitting: Internal Medicine

## 2017-04-19 VITALS — BP 124/80 | HR 70 | Ht 65.0 in | Wt 169.6 lb

## 2017-04-19 DIAGNOSIS — Z950 Presence of cardiac pacemaker: Secondary | ICD-10-CM | POA: Diagnosis not present

## 2017-04-19 DIAGNOSIS — I441 Atrioventricular block, second degree: Secondary | ICD-10-CM | POA: Diagnosis not present

## 2017-04-19 LAB — CUP PACEART INCLINIC DEVICE CHECK
Battery Voltage: 2.89 V
Brady Statistic RA Percent Paced: 6.72 %
Brady Statistic RV Percent Paced: 99.92 %
Date Time Interrogation Session: 20180913104545
Implantable Lead Implant Date: 20111031
Implantable Lead Location: 753860
Implantable Pulse Generator Implant Date: 20111031
Lead Channel Impedance Value: 424 Ohm
Lead Channel Impedance Value: 536 Ohm
Lead Channel Sensing Intrinsic Amplitude: 3.493 mV
Lead Channel Setting Pacing Pulse Width: 0.8 ms
MDC IDC LEAD IMPLANT DT: 20111031
MDC IDC LEAD LOCATION: 753859
MDC IDC MSMT LEADCHNL RA PACING THRESHOLD AMPLITUDE: 0.5 V
MDC IDC MSMT LEADCHNL RA PACING THRESHOLD PULSEWIDTH: 0.4 ms
MDC IDC MSMT LEADCHNL RV PACING THRESHOLD AMPLITUDE: 1 V
MDC IDC MSMT LEADCHNL RV PACING THRESHOLD PULSEWIDTH: 0.8 ms
MDC IDC MSMT LEADCHNL RV SENSING INTR AMPL: 20.041 mV
MDC IDC SET LEADCHNL RA PACING AMPLITUDE: 2 V
MDC IDC SET LEADCHNL RV PACING AMPLITUDE: 2.5 V
MDC IDC SET LEADCHNL RV SENSING SENSITIVITY: 0.9 mV
MDC IDC STAT BRADY AP VP PERCENT: 6.72 %
MDC IDC STAT BRADY AP VS PERCENT: 0 %
MDC IDC STAT BRADY AS VP PERCENT: 93.2 %
MDC IDC STAT BRADY AS VS PERCENT: 0.08 %

## 2017-04-19 NOTE — Progress Notes (Signed)
    PCP: Bufford SpikesMaynard, Jennifer S, NP (Inactive)  Primary EP:  Dr Jack QuartoAllred  Lori Gay is a 56 y.o. female who presents today for routine electrophysiology followup.  Since last being seen in our clinic, the patient reports doing very well.  She continues to work and remains active.  Her feet (chronic pain) is her primary concern.  Today, she denies symptoms of palpitations, chest pain, shortness of breath,  lower extremity edema, dizziness, presyncope, or syncope.  The patient is otherwise without complaint today.   Past Medical History:  Diagnosis Date  . AVNRT (AV nodal re-entry tachycardia) (HCC)    s/p slow pathway ablation by Dr Johney FrameAllred complicated by AV block requiring PPM  . DDD (degenerative disc disease)    back surgery x2  . Second degree AV block    s/p MDT REVO pacemaker by Dr Graciela HusbandsKlein  . Tobacco user    Past Surgical History:  Procedure Laterality Date  . ATRIAL ABLATION SURGERY  10/11   slow pathway; for inducible AVNRT  . PACEMAKER INSERTION  11/11   for AV block post ablation; Medtronic Revo by Dr Graciela HusbandsKlein  . TONSILLECTOMY      ROS- all systems are reviewed and negative except as per HPI above  Current Outpatient Prescriptions  Medication Sig Dispense Refill  . cetirizine (ZYRTEC) 10 MG tablet Take 10 mg by mouth daily as needed for allergies or rhinitis.     . clonazePAM (KLONOPIN) 0.5 MG tablet Take 0.5 mg by mouth 2 (two) times daily as needed for anxiety.     . NON FORMULARY Take 1 capsule by mouth daily as needed (vitamin supplement). Multi-B    . Probiotic Product (PROBIOTIC PO) Take 1 capsule by mouth daily as needed (digestive support).     No current facility-administered medications for this visit.     Physical Exam: Vitals:   04/19/17 0942  BP: 124/80  Pulse: 70  SpO2: 99%  Weight: 169 lb 9.6 oz (76.9 kg)  Height: 5\' 5"  (1.651 m)    GEN- The patient is well appearing, alert and oriented x 3 today.   Head- normocephalic, atraumatic Eyes-  Sclera  clear, conjunctiva pink Ears- hearing intact Oropharynx- clear Lungs- Clear to ausculation bilaterally, normal work of breathing Chest- pacemaker pocket is well healed Heart- Regular rate and rhythm, no murmurs, rubs or gallops, PMI not laterally displaced GI- soft, NT, ND, + BS Extremities- no clubbing, cyanosis, or edema  Pacemaker interrogation- reviewed in detail today,  See PACEART report  ekg tracing ordered today is personally reviewed and shows sinus rhythm with V pacing  Assessment and Plan:  1. Symptomatic second degree AV block Normal pacemaker function See Pace Art report No changes today Battery voltage is 2.89V Will start monthly carelink  2. Tobacco Remains quit  3. Hyperlipidemia Last lipid check was several years ago She states "they were a little high" fasting lipids, LFTs  4. Fatigue Improved intermittently Cbc and bmet today  carelink Return to see me in a year  Hillis RangeJames Evonna Stoltz MD, [facility] 04/19/2017 10:03 AM

## 2017-04-19 NOTE — Patient Instructions (Addendum)
Medication Instructions:   Your physician recommends that you continue on your current medications as directed. Please refer to the Current Medication list given to you today.   Labwork: Your physician recommends that you return for lab work---fasting. Script given to patient to have drawn   Testing/Procedures: None ordered   Follow-Up: Remote monitoring is used to monitor your Pacemaker from home. This monitoring reduces the number of office visits required to check your device to one time per year. It allows us to keep an eye on the functioning of your device to ensure it is working properly. You are scheduled for a device check from home on 05/21/17. You may send your transmission at any time that day. If you have a wireless device, the transmission will be sent automatically. After your physician reviews your transmission, you will receive a postcard with your next transmission date.   Your physician wants you to follow-up in: 12 months with Dr Jacquiline DoeAllred You will receive a reminder letter in the mail two months in advance. If you don't receive a letter, please call our office to schedule the follow-up appointment.    Any Other Special Instructions Will Be Listed Below (If Applicable).     If you need a refill on your cardiac medications before your next appointment, please call your pharmacy.

## 2017-04-26 ENCOUNTER — Other Ambulatory Visit: Payer: Self-pay | Admitting: Internal Medicine

## 2017-04-27 LAB — LIPID PANEL W/O CHOL/HDL RATIO
CHOLESTEROL TOTAL: 226 mg/dL — AB (ref 100–199)
HDL: 64 mg/dL (ref >39)
LDL CALC: 140 mg/dL — AB (ref 0–99)
Triglycerides: 111 mg/dL (ref 0–149)
VLDL Cholesterol Cal: 22 mg/dL (ref 5–40)

## 2017-04-27 LAB — HEPATIC FUNCTION PANEL
ALBUMIN: 4.5 g/dL (ref 3.5–5.5)
ALT: 38 IU/L — ABNORMAL HIGH (ref 0–32)
AST: 38 IU/L (ref 0–40)
Alkaline Phosphatase: 71 IU/L (ref 39–117)
BILIRUBIN TOTAL: 0.5 mg/dL (ref 0.0–1.2)
BILIRUBIN, DIRECT: 0.12 mg/dL (ref 0.00–0.40)
Total Protein: 6.9 g/dL (ref 6.0–8.5)

## 2017-04-27 LAB — CBC WITH DIFFERENTIAL/PLATELET
Basophils Absolute: 0 10*3/uL (ref 0.0–0.2)
Basos: 0 %
EOS (ABSOLUTE): 0.1 10*3/uL (ref 0.0–0.4)
EOS: 3 %
HEMATOCRIT: 39.7 % (ref 34.0–46.6)
Hemoglobin: 13.6 g/dL (ref 11.1–15.9)
IMMATURE GRANS (ABS): 0 10*3/uL (ref 0.0–0.1)
IMMATURE GRANULOCYTES: 0 %
LYMPHS: 38 %
Lymphocytes Absolute: 1.6 10*3/uL (ref 0.7–3.1)
MCH: 30.7 pg (ref 26.6–33.0)
MCHC: 34.3 g/dL (ref 31.5–35.7)
MCV: 90 fL (ref 79–97)
MONOS ABS: 0.4 10*3/uL (ref 0.1–0.9)
Monocytes: 8 %
NEUTROS ABS: 2.2 10*3/uL (ref 1.4–7.0)
NEUTROS PCT: 51 %
Platelets: 200 10*3/uL (ref 150–379)
RBC: 4.43 x10E6/uL (ref 3.77–5.28)
RDW: 13.4 % (ref 12.3–15.4)
WBC: 4.4 10*3/uL (ref 3.4–10.8)

## 2017-04-27 LAB — BASIC METABOLIC PANEL
BUN / CREAT RATIO: 16 (ref 9–23)
BUN: 14 mg/dL (ref 6–24)
CALCIUM: 9.2 mg/dL (ref 8.7–10.2)
CHLORIDE: 107 mmol/L — AB (ref 96–106)
CO2: 22 mmol/L (ref 20–29)
CREATININE: 0.85 mg/dL (ref 0.57–1.00)
GFR calc non Af Amer: 77 mL/min/{1.73_m2} (ref >59)
GFR, EST AFRICAN AMERICAN: 89 mL/min/{1.73_m2} (ref >59)
Glucose: 93 mg/dL (ref 65–99)
Potassium: 4.6 mmol/L (ref 3.5–5.2)
SODIUM: 143 mmol/L (ref 134–144)

## 2017-05-21 ENCOUNTER — Ambulatory Visit (INDEPENDENT_AMBULATORY_CARE_PROVIDER_SITE_OTHER): Payer: Self-pay | Admitting: *Deleted

## 2017-05-21 ENCOUNTER — Telehealth: Payer: Self-pay

## 2017-05-21 ENCOUNTER — Telehealth: Payer: Self-pay | Admitting: Cardiology

## 2017-05-21 DIAGNOSIS — I441 Atrioventricular block, second degree: Secondary | ICD-10-CM

## 2017-05-21 DIAGNOSIS — E785 Hyperlipidemia, unspecified: Secondary | ICD-10-CM

## 2017-05-21 MED ORDER — ROSUVASTATIN CALCIUM 5 MG PO TABS: 5.0000 mg | ORAL_TABLET | Freq: Every day | ORAL | 5 refills | Status: DC

## 2017-05-21 NOTE — Telephone Encounter (Signed)
LMOVM reminding pt to send remote transmission.   

## 2017-05-21 NOTE — Telephone Encounter (Signed)
Called patient to inform her that per Dr. Jenel Lucks recommendation about her elevated cholesterol panel she will need to start Crestor 5 mg qd and have a recheck in 8 weeks. Patient is agreeable to coming back Monday 06/25/17 for recheck and per patient request I will send RX to [facility] in [place].

## 2017-05-22 NOTE — Progress Notes (Signed)
Remote pacemaker transmission.   

## 2017-05-23 LAB — CUP PACEART REMOTE DEVICE CHECK
Brady Statistic AP VP Percent: 8.59 %
Brady Statistic AS VP Percent: 91.31 %
Brady Statistic RA Percent Paced: 8.59 %
Brady Statistic RV Percent Paced: 99.89 %
Date Time Interrogation Session: 20181015195026
Implantable Lead Implant Date: 20111031
Implantable Lead Location: 753859
Implantable Pulse Generator Implant Date: 20111031
Lead Channel Setting Pacing Amplitude: 2 V
Lead Channel Setting Pacing Amplitude: 2.5 V
Lead Channel Setting Pacing Pulse Width: 0.8 ms
Lead Channel Setting Sensing Sensitivity: 0.9 mV
MDC IDC LEAD IMPLANT DT: 20111031
MDC IDC LEAD LOCATION: 753860
MDC IDC MSMT BATTERY VOLTAGE: 2.89 V
MDC IDC MSMT LEADCHNL RA IMPEDANCE VALUE: 416 Ohm
MDC IDC MSMT LEADCHNL RA SENSING INTR AMPL: 2.803 mV
MDC IDC MSMT LEADCHNL RV IMPEDANCE VALUE: 528 Ohm
MDC IDC STAT BRADY AP VS PERCENT: 0 %
MDC IDC STAT BRADY AS VS PERCENT: 0.1 %

## 2017-05-25 ENCOUNTER — Encounter: Payer: Self-pay | Admitting: Cardiology

## 2017-06-22 ENCOUNTER — Telehealth: Payer: Self-pay | Admitting: Cardiology

## 2017-06-22 ENCOUNTER — Ambulatory Visit (INDEPENDENT_AMBULATORY_CARE_PROVIDER_SITE_OTHER): Payer: Self-pay | Admitting: *Deleted

## 2017-06-22 DIAGNOSIS — I441 Atrioventricular block, second degree: Secondary | ICD-10-CM

## 2017-06-22 NOTE — Telephone Encounter (Signed)
Spoke with pt and reminded pt of remote transmission that is due today. Pt verbalized understanding.   

## 2017-06-25 ENCOUNTER — Other Ambulatory Visit: Payer: Commercial Managed Care - PPO | Admitting: *Deleted

## 2017-06-25 DIAGNOSIS — E785 Hyperlipidemia, unspecified: Secondary | ICD-10-CM

## 2017-06-25 LAB — LIPID PANEL
CHOL/HDL RATIO: 2.3 ratio (ref 0.0–4.4)
Cholesterol, Total: 152 mg/dL (ref 100–199)
HDL: 66 mg/dL (ref >39)
LDL Calculated: 67 mg/dL (ref 0–99)
Triglycerides: 95 mg/dL (ref 0–149)
VLDL Cholesterol Cal: 19 mg/dL (ref 5–40)

## 2017-06-25 NOTE — Progress Notes (Signed)
Remote pacemaker transmission.   

## 2017-06-27 ENCOUNTER — Encounter: Payer: Self-pay | Admitting: Cardiology

## 2017-07-04 ENCOUNTER — Other Ambulatory Visit: Payer: Self-pay

## 2017-07-04 MED ORDER — ROSUVASTATIN CALCIUM 5 MG PO TABS: 5.0000 mg | ORAL_TABLET | Freq: Every day | ORAL | 3 refills | Status: DC

## 2017-07-09 LAB — CUP PACEART REMOTE DEVICE CHECK
Battery Voltage: 2.88 V
Brady Statistic AP VP Percent: 10.31 %
Brady Statistic AS VP Percent: 89.64 %
Brady Statistic AS VS Percent: 0.04 %
Implantable Lead Location: 753859
Implantable Pulse Generator Implant Date: 20111031
Lead Channel Impedance Value: 536 Ohm
Lead Channel Sensing Intrinsic Amplitude: 2.544 mV
Lead Channel Sensing Intrinsic Amplitude: 20.041 mV
Lead Channel Setting Pacing Pulse Width: 0.8 ms
MDC IDC LEAD IMPLANT DT: 20111031
MDC IDC LEAD IMPLANT DT: 20111031
MDC IDC LEAD LOCATION: 753860
MDC IDC MSMT LEADCHNL RA IMPEDANCE VALUE: 416 Ohm
MDC IDC SESS DTM: 20181116201033
MDC IDC SET LEADCHNL RA PACING AMPLITUDE: 2 V
MDC IDC SET LEADCHNL RV PACING AMPLITUDE: 2.5 V
MDC IDC SET LEADCHNL RV SENSING SENSITIVITY: 0.9 mV
MDC IDC STAT BRADY AP VS PERCENT: 0 %
MDC IDC STAT BRADY RA PERCENT PACED: 10.31 %
MDC IDC STAT BRADY RV PERCENT PACED: 99.95 %

## 2017-07-26 ENCOUNTER — Telehealth: Payer: Self-pay | Admitting: Cardiology

## 2017-07-26 ENCOUNTER — Encounter: Payer: Commercial Managed Care - PPO | Admitting: *Deleted

## 2017-07-26 NOTE — Telephone Encounter (Signed)
LMOVM reminding pt to send remote transmission.   

## 2017-07-27 ENCOUNTER — Encounter: Payer: Self-pay | Admitting: Cardiology

## 2017-07-30 ENCOUNTER — Ambulatory Visit (INDEPENDENT_AMBULATORY_CARE_PROVIDER_SITE_OTHER): Payer: Commercial Managed Care - PPO | Admitting: *Deleted

## 2017-07-30 DIAGNOSIS — I441 Atrioventricular block, second degree: Secondary | ICD-10-CM | POA: Diagnosis not present

## 2017-08-01 ENCOUNTER — Encounter: Payer: Self-pay | Admitting: Cardiology

## 2017-08-01 LAB — CUP PACEART REMOTE DEVICE CHECK
Battery Voltage: 2.88 V
Brady Statistic AP VP Percent: 7.14 %
Brady Statistic AS VS Percent: 0.11 %
Brady Statistic RA Percent Paced: 7.14 %
Brady Statistic RV Percent Paced: 99.87 %
Date Time Interrogation Session: 20181224155403
Implantable Lead Implant Date: 20111031
Implantable Lead Location: 753860
Implantable Pulse Generator Implant Date: 20111031
Lead Channel Impedance Value: 416 Ohm
Lead Channel Setting Pacing Amplitude: 2 V
Lead Channel Setting Sensing Sensitivity: 0.9 mV
MDC IDC LEAD IMPLANT DT: 20111031
MDC IDC LEAD LOCATION: 753859
MDC IDC MSMT LEADCHNL RA SENSING INTR AMPL: 2.846 mV
MDC IDC MSMT LEADCHNL RV IMPEDANCE VALUE: 544 Ohm
MDC IDC SET LEADCHNL RV PACING AMPLITUDE: 2.5 V
MDC IDC SET LEADCHNL RV PACING PULSEWIDTH: 0.8 ms
MDC IDC STAT BRADY AP VS PERCENT: 0 %
MDC IDC STAT BRADY AS VP PERCENT: 92.75 %

## 2017-08-01 NOTE — Progress Notes (Signed)
Remote pacemaker transmission.   

## 2017-09-03 ENCOUNTER — Ambulatory Visit (INDEPENDENT_AMBULATORY_CARE_PROVIDER_SITE_OTHER): Payer: Self-pay | Admitting: *Deleted

## 2017-09-03 ENCOUNTER — Telehealth: Payer: Self-pay | Admitting: Cardiology

## 2017-09-03 DIAGNOSIS — Z950 Presence of cardiac pacemaker: Secondary | ICD-10-CM

## 2017-09-03 NOTE — Telephone Encounter (Signed)
LMOVM reminding pt to send remote transmission.   

## 2017-09-04 LAB — CUP PACEART REMOTE DEVICE CHECK
Battery Voltage: 2.88 V
Brady Statistic AP VS Percent: 0 %
Brady Statistic AS VS Percent: 0.09 %
Implantable Lead Implant Date: 20111031
Implantable Lead Location: 753860
Lead Channel Impedance Value: 432 Ohm
Lead Channel Sensing Intrinsic Amplitude: 3.234 mV
Lead Channel Setting Pacing Amplitude: 2.5 V
Lead Channel Setting Pacing Pulse Width: 0.8 ms
MDC IDC LEAD IMPLANT DT: 20111031
MDC IDC LEAD LOCATION: 753859
MDC IDC MSMT LEADCHNL RV IMPEDANCE VALUE: 560 Ohm
MDC IDC PG IMPLANT DT: 20111031
MDC IDC SESS DTM: 20190128224919
MDC IDC SET LEADCHNL RA PACING AMPLITUDE: 2 V
MDC IDC SET LEADCHNL RV SENSING SENSITIVITY: 0.9 mV
MDC IDC STAT BRADY AP VP PERCENT: 4.31 %
MDC IDC STAT BRADY AS VP PERCENT: 95.6 %
MDC IDC STAT BRADY RA PERCENT PACED: 4.31 %
MDC IDC STAT BRADY RV PERCENT PACED: 99.9 %

## 2017-09-04 NOTE — Progress Notes (Signed)
Remote pacemaker transmission.   

## 2017-09-06 ENCOUNTER — Encounter: Payer: Self-pay | Admitting: Cardiology

## 2017-10-04 ENCOUNTER — Ambulatory Visit (INDEPENDENT_AMBULATORY_CARE_PROVIDER_SITE_OTHER): Payer: Commercial Managed Care - PPO | Admitting: *Deleted

## 2017-10-04 ENCOUNTER — Telehealth: Payer: Self-pay | Admitting: Cardiology

## 2017-10-04 DIAGNOSIS — Z950 Presence of cardiac pacemaker: Secondary | ICD-10-CM

## 2017-10-04 NOTE — Telephone Encounter (Signed)
Spoke with pt and reminded pt of remote transmission that is due today. Pt verbalized understanding.   

## 2017-10-05 ENCOUNTER — Encounter: Payer: Self-pay | Admitting: Cardiology

## 2017-10-05 LAB — CUP PACEART REMOTE DEVICE CHECK
Brady Statistic AP VP Percent: 10.28 %
Brady Statistic AP VS Percent: 0 %
Brady Statistic AS VS Percent: 0.08 %
Implantable Lead Implant Date: 20111031
Implantable Lead Location: 753860
Lead Channel Impedance Value: 424 Ohm
Lead Channel Impedance Value: 536 Ohm
Lead Channel Sensing Intrinsic Amplitude: 2.76 mV
Lead Channel Setting Pacing Amplitude: 2 V
Lead Channel Setting Pacing Amplitude: 2.5 V
MDC IDC LEAD IMPLANT DT: 20111031
MDC IDC LEAD LOCATION: 753859
MDC IDC MSMT BATTERY VOLTAGE: 2.86 V
MDC IDC PG IMPLANT DT: 20111031
MDC IDC SESS DTM: 20190301020555
MDC IDC SET LEADCHNL RV PACING PULSEWIDTH: 0.8 ms
MDC IDC SET LEADCHNL RV SENSING SENSITIVITY: 0.9 mV
MDC IDC STAT BRADY AS VP PERCENT: 89.64 %
MDC IDC STAT BRADY RA PERCENT PACED: 10.28 %
MDC IDC STAT BRADY RV PERCENT PACED: 99.91 %

## 2017-10-05 NOTE — Progress Notes (Signed)
Remote pacemaker transmission.   

## 2017-11-05 ENCOUNTER — Ambulatory Visit (INDEPENDENT_AMBULATORY_CARE_PROVIDER_SITE_OTHER): Payer: Commercial Managed Care - PPO | Admitting: *Deleted

## 2017-11-05 ENCOUNTER — Telehealth: Payer: Self-pay | Admitting: Cardiology

## 2017-11-05 DIAGNOSIS — I441 Atrioventricular block, second degree: Secondary | ICD-10-CM | POA: Diagnosis not present

## 2017-11-05 NOTE — Telephone Encounter (Signed)
LMOVM reminding pt to send remote transmission.   

## 2017-11-06 ENCOUNTER — Encounter: Payer: Self-pay | Admitting: Cardiology

## 2017-11-06 NOTE — Progress Notes (Signed)
Remote pacemaker transmission.   

## 2017-11-09 LAB — CUP PACEART REMOTE DEVICE CHECK
Battery Voltage: 2.86 V
Brady Statistic AS VP Percent: 91.37 %
Brady Statistic AS VS Percent: 0.03 %
Brady Statistic RA Percent Paced: 8.6 %
Date Time Interrogation Session: 20190401223321
Implantable Lead Implant Date: 20111031
Implantable Lead Location: 753860
Lead Channel Impedance Value: 432 Ohm
Lead Channel Sensing Intrinsic Amplitude: 2.975 mV
Lead Channel Setting Pacing Amplitude: 2.5 V
Lead Channel Setting Pacing Pulse Width: 0.8 ms
MDC IDC LEAD IMPLANT DT: 20111031
MDC IDC LEAD LOCATION: 753859
MDC IDC MSMT LEADCHNL RV IMPEDANCE VALUE: 528 Ohm
MDC IDC PG IMPLANT DT: 20111031
MDC IDC SET LEADCHNL RA PACING AMPLITUDE: 2 V
MDC IDC SET LEADCHNL RV SENSING SENSITIVITY: 0.9 mV
MDC IDC STAT BRADY AP VP PERCENT: 8.6 %
MDC IDC STAT BRADY AP VS PERCENT: 0 %
MDC IDC STAT BRADY RV PERCENT PACED: 99.97 %

## 2017-12-06 ENCOUNTER — Ambulatory Visit (INDEPENDENT_AMBULATORY_CARE_PROVIDER_SITE_OTHER): Payer: Self-pay | Admitting: *Deleted

## 2017-12-06 DIAGNOSIS — I441 Atrioventricular block, second degree: Secondary | ICD-10-CM

## 2017-12-06 NOTE — Progress Notes (Signed)
Remote pacemaker transmission.   

## 2017-12-11 ENCOUNTER — Encounter: Payer: Self-pay | Admitting: Cardiology

## 2017-12-20 LAB — CUP PACEART REMOTE DEVICE CHECK
Battery Voltage: 2.85 V
Brady Statistic AP VP Percent: 12.04 %
Brady Statistic AS VP Percent: 87.94 %
Brady Statistic RA Percent Paced: 12.04 %
Brady Statistic RV Percent Paced: 99.98 %
Date Time Interrogation Session: 20190501113213
Implantable Lead Location: 753859
Implantable Lead Location: 753860
Implantable Pulse Generator Implant Date: 20111031
Lead Channel Sensing Intrinsic Amplitude: 3.493 mV
Lead Channel Setting Pacing Amplitude: 2 V
Lead Channel Setting Pacing Pulse Width: 0.8 ms
MDC IDC LEAD IMPLANT DT: 20111031
MDC IDC LEAD IMPLANT DT: 20111031
MDC IDC MSMT LEADCHNL RA IMPEDANCE VALUE: 440 Ohm
MDC IDC MSMT LEADCHNL RV IMPEDANCE VALUE: 560 Ohm
MDC IDC SET LEADCHNL RV PACING AMPLITUDE: 2.5 V
MDC IDC SET LEADCHNL RV SENSING SENSITIVITY: 0.9 mV
MDC IDC STAT BRADY AP VS PERCENT: 0 %
MDC IDC STAT BRADY AS VS PERCENT: 0.02 %

## 2018-01-07 ENCOUNTER — Telehealth: Payer: Self-pay | Admitting: Cardiology

## 2018-01-07 ENCOUNTER — Ambulatory Visit (INDEPENDENT_AMBULATORY_CARE_PROVIDER_SITE_OTHER): Payer: Self-pay | Admitting: *Deleted

## 2018-01-07 DIAGNOSIS — Z950 Presence of cardiac pacemaker: Secondary | ICD-10-CM

## 2018-01-07 NOTE — Telephone Encounter (Signed)
LMOVM reminding pt to send remote transmission.   

## 2018-01-08 NOTE — Progress Notes (Signed)
Remote pacemaker transmission.   

## 2018-01-09 ENCOUNTER — Encounter: Payer: Self-pay | Admitting: Cardiology

## 2018-02-08 ENCOUNTER — Encounter: Payer: Commercial Managed Care - PPO | Admitting: *Deleted

## 2018-02-08 ENCOUNTER — Telehealth: Payer: Self-pay | Admitting: Cardiology

## 2018-02-08 NOTE — Telephone Encounter (Signed)
LMOVM reminding pt to send remote transmission.   

## 2018-02-12 ENCOUNTER — Ambulatory Visit (INDEPENDENT_AMBULATORY_CARE_PROVIDER_SITE_OTHER): Payer: Commercial Managed Care - PPO | Admitting: *Deleted

## 2018-02-12 DIAGNOSIS — I442 Atrioventricular block, complete: Secondary | ICD-10-CM | POA: Diagnosis not present

## 2018-02-12 NOTE — Progress Notes (Signed)
Remote pacemaker transmission.   

## 2018-02-27 LAB — CUP PACEART REMOTE DEVICE CHECK
Battery Voltage: 2.83 V
Brady Statistic AP VS Percent: 0 %
Brady Statistic AS VS Percent: 0.06 %
Implantable Lead Implant Date: 20111031
Implantable Lead Implant Date: 20111031
Implantable Lead Location: 753860
Lead Channel Impedance Value: 408 Ohm
Lead Channel Impedance Value: 536 Ohm
Lead Channel Setting Pacing Amplitude: 2 V
Lead Channel Setting Pacing Amplitude: 2.5 V
MDC IDC LEAD LOCATION: 753859
MDC IDC MSMT LEADCHNL RA SENSING INTR AMPL: 2.975 mV
MDC IDC PG IMPLANT DT: 20111031
MDC IDC SESS DTM: 20190709112708
MDC IDC SET LEADCHNL RV PACING PULSEWIDTH: 0.8 ms
MDC IDC SET LEADCHNL RV SENSING SENSITIVITY: 0.9 mV
MDC IDC STAT BRADY AP VP PERCENT: 9.38 %
MDC IDC STAT BRADY AS VP PERCENT: 90.56 %
MDC IDC STAT BRADY RA PERCENT PACED: 9.38 %
MDC IDC STAT BRADY RV PERCENT PACED: 99.93 %

## 2018-03-25 ENCOUNTER — Ambulatory Visit (INDEPENDENT_AMBULATORY_CARE_PROVIDER_SITE_OTHER): Payer: Commercial Managed Care - PPO | Admitting: *Deleted

## 2018-03-25 DIAGNOSIS — Z950 Presence of cardiac pacemaker: Secondary | ICD-10-CM

## 2018-03-26 ENCOUNTER — Telehealth: Payer: Self-pay | Admitting: Cardiology

## 2018-03-26 NOTE — Telephone Encounter (Signed)
LMOVM reminding pt to send remote transmission.   

## 2018-03-27 NOTE — Progress Notes (Signed)
Remote pacemaker transmission.   

## 2018-04-19 LAB — CUP PACEART REMOTE DEVICE CHECK
Battery Voltage: 2.83 V
Brady Statistic AS VP Percent: 88.55 %
Brady Statistic RA Percent Paced: 11.39 %
Brady Statistic RV Percent Paced: 99.93 %
Implantable Lead Implant Date: 20111031
Implantable Lead Location: 753859
Implantable Pulse Generator Implant Date: 20111031
Lead Channel Sensing Intrinsic Amplitude: 20.041 mV
Lead Channel Setting Pacing Amplitude: 2 V
Lead Channel Setting Pacing Amplitude: 2.5 V
Lead Channel Setting Pacing Pulse Width: 0.8 ms
Lead Channel Setting Sensing Sensitivity: 0.9 mV
MDC IDC LEAD IMPLANT DT: 20111031
MDC IDC LEAD LOCATION: 753860
MDC IDC MSMT LEADCHNL RA IMPEDANCE VALUE: 408 Ohm
MDC IDC MSMT LEADCHNL RA SENSING INTR AMPL: 2.544 mV
MDC IDC MSMT LEADCHNL RV IMPEDANCE VALUE: 520 Ohm
MDC IDC SESS DTM: 20190820233333
MDC IDC STAT BRADY AP VP PERCENT: 11.39 %
MDC IDC STAT BRADY AP VS PERCENT: 0 %
MDC IDC STAT BRADY AS VS PERCENT: 0.06 %

## 2018-04-25 ENCOUNTER — Ambulatory Visit: Payer: Commercial Managed Care - PPO | Admitting: Internal Medicine

## 2018-04-25 ENCOUNTER — Encounter: Payer: Self-pay | Admitting: Internal Medicine

## 2018-04-25 ENCOUNTER — Encounter (INDEPENDENT_AMBULATORY_CARE_PROVIDER_SITE_OTHER): Payer: Self-pay

## 2018-04-25 VITALS — BP 116/74 | HR 66 | Ht 65.0 in | Wt 174.0 lb

## 2018-04-25 DIAGNOSIS — I441 Atrioventricular block, second degree: Secondary | ICD-10-CM

## 2018-04-25 DIAGNOSIS — R5383 Other fatigue: Secondary | ICD-10-CM | POA: Diagnosis not present

## 2018-04-25 DIAGNOSIS — Z950 Presence of cardiac pacemaker: Secondary | ICD-10-CM | POA: Diagnosis not present

## 2018-04-25 NOTE — Patient Instructions (Addendum)
Medication Instructions:  Your physician recommends that you continue on your current medications as directed. Please refer to the Current Medication list given to you today.  Labwork: None ordered.  Testing/Procedures: Your physician has requested that you have an echocardiogram. Echocardiography is a painless test that uses sound waves to create images of your heart. It provides your doctor with information about the size and shape of your heart and how well your heart's chambers and valves are working. This procedure takes approximately one hour. There are no restrictions for this procedure.  Patient needs to be scheduled for ECHO in [place].  Follow-Up: Your physician wants you to follow-up in: one year with Dr. Johney FrameAllred.  You will receive a reminder letter in the mail two months in advance. If you don't receive a letter, please call our office to schedule the follow-up appointment.  Remote monitoring is used to monitor your Pacemaker from home. This monitoring reduces the number of office visits required to check your device to one time per year. It allows us to keep an eye on the functioning of your device to ensure it is working properly. You are scheduled for a device check from home on 05/27/2018. You may send your transmission at any time that day. If you have a wireless device, the transmission will be sent automatically. After your physician reviews your transmission, you will receive a postcard with your next transmission date.  Any Other Special Instructions Will Be Listed Below (If Applicable).  If you need a refill on your cardiac medications before your next appointment, please call your pharmacy.

## 2018-04-25 NOTE — Progress Notes (Signed)
PCP: Patient, No Pcp Per   Primary EP:  Dr Johney FrameAllred  Lori ChartersLisa Gay is a 57 y.o. female who presents today for routine electrophysiology followup.  Since last being seen in our clinic, the patient reports doing very well.  Today, she denies symptoms of palpitations, chest pain, shortness of breath,  lower extremity edema, dizziness, presyncope, or syncope.  Her primary concern is with fatigue.  This has been a chronic issue. The patient is otherwise without complaint today.   Past Medical History:  Diagnosis Date  . AVNRT (AV nodal re-entry tachycardia) (HCC)    s/p slow pathway ablation by Dr Johney FrameAllred complicated by AV block requiring PPM  . DDD (degenerative disc disease)    back surgery x2  . Second degree AV block    s/p MDT REVO pacemaker by Dr Graciela HusbandsKlein  . Tobacco user    Past Surgical History:  Procedure Laterality Date  . ATRIAL ABLATION SURGERY  10/11   slow pathway; for inducible AVNRT  . PACEMAKER INSERTION  11/11   for AV block post ablation; Medtronic Revo by Dr Graciela HusbandsKlein  . TONSILLECTOMY      ROS- all systems are reviewed and negative except as per HPI above  Current Outpatient Medications  Medication Sig Dispense Refill  . cetirizine (ZYRTEC) 10 MG tablet Take 10 mg by mouth daily as needed for allergies or rhinitis.     . clonazePAM (KLONOPIN) 1 MG tablet Take 1 tablet by mouth as needed for anxiety.    . NON FORMULARY Take 1 capsule by mouth daily as needed (vitamin supplement). Multi-B    . Probiotic Product (PROBIOTIC PO) Take 1 capsule by mouth daily as needed (digestive support).    . rosuvastatin (CRESTOR) 5 MG tablet Take 1 tablet (5 mg total) by mouth daily. 90 tablet 3   No current facility-administered medications for this visit.     Physical Exam: Vitals:   04/25/18 0808  BP: 116/74  Pulse: 66  SpO2: 99%  Weight: 174 lb (78.9 kg)  Height: 5\' 5"  (1.651 m)    GEN- The patient is well appearing, alert and oriented x 3 today.   Head- normocephalic,  atraumatic Eyes-  Sclera clear, conjunctiva pink Ears- hearing intact Oropharynx- clear Lungs- Clear to ausculation bilaterally, normal work of breathing Chest- pacemaker pocket is well healed Heart- Regular rate and rhythm, no murmurs, rubs or gallops, PMI not laterally displaced GI- soft, NT, ND, + BS Extremities- no clubbing, cyanosis, or edema  Pacemaker interrogation- reviewed in detail today,  See PACEART report  ekg tracing ordered today is personally reviewed and shows sinus with V pacing  Assessment and Plan:  1. Symptomatic second degree heart block Normal pacemaker function See Pace Art report No changes today She is approaching ERI.  (votage 2.82 today). Risks, benefits, and alternatives to pacemaker pulse generator replacement were discussed in detail today.  The patient understands that risks include but are not limited to bleeding, infection, pneumothorax, perforation, tamponade, vascular damage, renal failure, MI, stroke, death, damage to his existing leads, and lead dislodgement and wishes to proceed.  We will therefore schedule the procedure without another office visit required once she reaches ERI.  The importance of monthly carelink transmissions was discussed today. Obtain echo to evaluate for changes in EF  2.  HL Stable No change required today Labs from 11/18 reviewed  3. Tobacco Remains quit  4. Fatigue Update echo I have offered a sleep study but she declines  Carelink  Hillis Range MD, [facility] 04/25/2018 8:18 AM

## 2018-04-29 ENCOUNTER — Other Ambulatory Visit: Payer: Self-pay

## 2018-04-29 ENCOUNTER — Ambulatory Visit (HOSPITAL_COMMUNITY): Payer: Commercial Managed Care - PPO | Attending: Cardiology

## 2018-04-29 DIAGNOSIS — R5383 Other fatigue: Secondary | ICD-10-CM | POA: Diagnosis present

## 2018-04-29 DIAGNOSIS — Z720 Tobacco use: Secondary | ICD-10-CM | POA: Diagnosis not present

## 2018-05-27 ENCOUNTER — Ambulatory Visit (INDEPENDENT_AMBULATORY_CARE_PROVIDER_SITE_OTHER): Payer: Self-pay | Admitting: *Deleted

## 2018-05-27 DIAGNOSIS — Z950 Presence of cardiac pacemaker: Secondary | ICD-10-CM

## 2018-05-27 NOTE — Progress Notes (Signed)
Remote pacemaker transmission.   

## 2018-05-28 ENCOUNTER — Encounter: Payer: Self-pay | Admitting: Cardiology

## 2018-06-18 LAB — CUP PACEART REMOTE DEVICE CHECK
Battery Voltage: 2.82 V
Brady Statistic AS VS Percent: 0.02 %
Brady Statistic RA Percent Paced: 13.33 %
Date Time Interrogation Session: 20191021111513
Implantable Lead Implant Date: 20111031
Implantable Lead Location: 753859
Implantable Lead Location: 753860
Implantable Pulse Generator Implant Date: 20111031
Lead Channel Impedance Value: 576 Ohm
Lead Channel Sensing Intrinsic Amplitude: 3.105 mV
Lead Channel Setting Sensing Sensitivity: 2.1 mV
MDC IDC LEAD IMPLANT DT: 20111031
MDC IDC MSMT LEADCHNL RA IMPEDANCE VALUE: 432 Ohm
MDC IDC MSMT LEADCHNL RV SENSING INTR AMPL: 20.041 mV
MDC IDC SET LEADCHNL RA PACING AMPLITUDE: 2 V
MDC IDC SET LEADCHNL RV PACING AMPLITUDE: 2.5 V
MDC IDC SET LEADCHNL RV PACING PULSEWIDTH: 0.8 ms
MDC IDC STAT BRADY AP VP PERCENT: 13.33 %
MDC IDC STAT BRADY AP VS PERCENT: 0.01 %
MDC IDC STAT BRADY AS VP PERCENT: 86.64 %
MDC IDC STAT BRADY RV PERCENT PACED: 99.97 %

## 2018-06-27 ENCOUNTER — Ambulatory Visit (INDEPENDENT_AMBULATORY_CARE_PROVIDER_SITE_OTHER): Payer: Commercial Managed Care - PPO

## 2018-06-27 DIAGNOSIS — Z950 Presence of cardiac pacemaker: Secondary | ICD-10-CM

## 2018-06-27 NOTE — Progress Notes (Signed)
Remote pacemaker transmission.   

## 2018-06-28 ENCOUNTER — Encounter: Payer: Self-pay | Admitting: Cardiology

## 2018-07-29 ENCOUNTER — Ambulatory Visit (INDEPENDENT_AMBULATORY_CARE_PROVIDER_SITE_OTHER): Payer: Commercial Managed Care - PPO

## 2018-07-29 DIAGNOSIS — I442 Atrioventricular block, complete: Secondary | ICD-10-CM

## 2018-07-29 LAB — CUP PACEART REMOTE DEVICE CHECK
Brady Statistic RV Percent Paced: 96.19 %
Date Time Interrogation Session: 20191223154919
Implantable Lead Implant Date: 20111031
Implantable Lead Location: 753859
Implantable Pulse Generator Implant Date: 20111031
Lead Channel Impedance Value: 424 Ohm
Lead Channel Sensing Intrinsic Amplitude: 20.041 mV
Lead Channel Setting Pacing Amplitude: 2.5 V
Lead Channel Setting Sensing Sensitivity: 2.1 mV
MDC IDC LEAD IMPLANT DT: 20111031
MDC IDC LEAD LOCATION: 753860
MDC IDC MSMT BATTERY VOLTAGE: 2.81 V
MDC IDC MSMT LEADCHNL RA SENSING INTR AMPL: 3.406 mV
MDC IDC MSMT LEADCHNL RV IMPEDANCE VALUE: 544 Ohm
MDC IDC SET LEADCHNL RV PACING PULSEWIDTH: 0.8 ms

## 2018-07-30 ENCOUNTER — Telehealth: Payer: Self-pay

## 2018-07-30 DIAGNOSIS — I442 Atrioventricular block, complete: Secondary | ICD-10-CM

## 2018-07-30 NOTE — Telephone Encounter (Signed)
Call placed to Pt.  Scheduled for gen change on August 22 2018 at 2:30 pm.  Pt to come in for labs on January 3.  Will cont to monitor.

## 2018-08-01 NOTE — Progress Notes (Signed)
Remote pacemaker transmission.   

## 2018-08-02 ENCOUNTER — Other Ambulatory Visit: Payer: Commercial Managed Care - PPO | Admitting: *Deleted

## 2018-08-02 DIAGNOSIS — I442 Atrioventricular block, complete: Secondary | ICD-10-CM

## 2018-08-02 NOTE — Telephone Encounter (Signed)
Instruction letter complete and at front desk.  Scheduled labs for today per Pt request.

## 2018-08-03 LAB — CBC WITH DIFFERENTIAL/PLATELET
BASOS ABS: 0 10*3/uL (ref 0.0–0.2)
Basos: 1 %
EOS (ABSOLUTE): 0.1 10*3/uL (ref 0.0–0.4)
Eos: 2 %
Hematocrit: 40.6 % (ref 34.0–46.6)
Hemoglobin: 14.3 g/dL (ref 11.1–15.9)
Immature Grans (Abs): 0 10*3/uL (ref 0.0–0.1)
Immature Granulocytes: 0 %
Lymphocytes Absolute: 2.2 10*3/uL (ref 0.7–3.1)
Lymphs: 34 %
MCH: 31.2 pg (ref 26.6–33.0)
MCHC: 35.2 g/dL (ref 31.5–35.7)
MCV: 89 fL (ref 79–97)
Monocytes Absolute: 0.6 10*3/uL (ref 0.1–0.9)
Monocytes: 9 %
NEUTROS ABS: 3.5 10*3/uL (ref 1.4–7.0)
Neutrophils: 54 %
Platelets: 204 10*3/uL (ref 150–450)
RBC: 4.58 x10E6/uL (ref 3.77–5.28)
RDW: 13.1 % (ref 12.3–15.4)
WBC: 6.5 10*3/uL (ref 3.4–10.8)

## 2018-08-03 LAB — BASIC METABOLIC PANEL
BUN/Creatinine Ratio: 15 (ref 9–23)
BUN: 13 mg/dL (ref 6–24)
CHLORIDE: 100 mmol/L (ref 96–106)
CO2: 23 mmol/L (ref 20–29)
Calcium: 9.5 mg/dL (ref 8.7–10.2)
Creatinine, Ser: 0.86 mg/dL (ref 0.57–1.00)
GFR calc Af Amer: 87 mL/min/{1.73_m2} (ref >59)
GFR calc non Af Amer: 76 mL/min/{1.73_m2} (ref >59)
Glucose: 87 mg/dL (ref 65–99)
Potassium: 5.1 mmol/L (ref 3.5–5.2)
Sodium: 137 mmol/L (ref 134–144)

## 2018-08-22 ENCOUNTER — Ambulatory Visit (HOSPITAL_COMMUNITY)
Admission: RE | Admit: 2018-08-22 | Discharge: 2018-08-22 | Disposition: A | Discharge status: Home / Self Care | Payer: Commercial Managed Care - PPO | Attending: Internal Medicine | Admitting: Internal Medicine

## 2018-08-22 ENCOUNTER — Encounter (HOSPITAL_COMMUNITY)
Admission: RE | Disposition: A | Discharge status: Home / Self Care | Payer: Self-pay | Source: Home / Self Care | Attending: Internal Medicine

## 2018-08-22 ENCOUNTER — Other Ambulatory Visit: Payer: Self-pay

## 2018-08-22 ENCOUNTER — Encounter (HOSPITAL_COMMUNITY): Payer: Self-pay | Admitting: Internal Medicine

## 2018-08-22 DIAGNOSIS — I441 Atrioventricular block, second degree: Secondary | ICD-10-CM

## 2018-08-22 DIAGNOSIS — Z4502 Encounter for adjustment and management of automatic implantable cardiac defibrillator: Secondary | ICD-10-CM | POA: Insufficient documentation

## 2018-08-22 DIAGNOSIS — Z45010 Encounter for checking and testing of cardiac pacemaker pulse generator [battery]: Secondary | ICD-10-CM

## 2018-08-22 DIAGNOSIS — I442 Atrioventricular block, complete: Secondary | ICD-10-CM | POA: Diagnosis not present

## 2018-08-22 DIAGNOSIS — Z79899 Other long term (current) drug therapy: Secondary | ICD-10-CM | POA: Diagnosis not present

## 2018-08-22 HISTORY — PX: PPM GENERATOR CHANGEOUT: EP1233

## 2018-08-22 LAB — SURGICAL PCR SCREEN
MRSA, PCR: NEGATIVE
Staphylococcus aureus: NEGATIVE

## 2018-08-22 SURGERY — PPM GENERATOR CHANGEOUT

## 2018-08-22 MED ORDER — SODIUM CHLORIDE 0.9% FLUSH: 3.0000 mL | INTRAVENOUS | Status: DC | PRN

## 2018-08-22 MED ORDER — SODIUM CHLORIDE 0.9% FLUSH: 3.0000 mL | Freq: Two times a day (BID) | INTRAVENOUS | Status: DC

## 2018-08-22 MED ORDER — SODIUM CHLORIDE 0.9 % IV SOLN
INTRAVENOUS | Status: AC
  Filled 2018-08-22: qty 2

## 2018-08-22 MED ORDER — LIDOCAINE HCL (PF) 1 % IJ SOLN
INTRAMUSCULAR | Status: DC | PRN
  Administered 2018-08-22: 60 mL

## 2018-08-22 MED ORDER — SODIUM CHLORIDE 0.9 % IV SOLN
80.0000 mg | INTRAVENOUS | Status: AC
  Administered 2018-08-22: 80 mg

## 2018-08-22 MED ORDER — MUPIROCIN 2 % EX OINT
TOPICAL_OINTMENT | CUTANEOUS | Status: AC
  Filled 2018-08-22: qty 22

## 2018-08-22 MED ORDER — VANCOMYCIN HCL IN DEXTROSE 1-5 GM/200ML-% IV SOLN
1000.0000 mg | INTRAVENOUS | Status: AC
  Administered 2018-08-22: 1000 mg via INTRAVENOUS

## 2018-08-22 MED ORDER — CHLORHEXIDINE GLUCONATE 4 % EX LIQD: 60.0000 mL | Freq: Once | CUTANEOUS | Status: DC

## 2018-08-22 MED ORDER — MIDAZOLAM HCL 5 MG/5ML IJ SOLN
INTRAMUSCULAR | Status: AC
  Filled 2018-08-22: qty 5

## 2018-08-22 MED ORDER — ACETAMINOPHEN 325 MG PO TABS: 325.0000 mg | ORAL_TABLET | ORAL | Status: DC | PRN

## 2018-08-22 MED ORDER — MUPIROCIN 2 % EX OINT
1.0000 "application " | TOPICAL_OINTMENT | Freq: Once | CUTANEOUS | Status: AC
  Administered 2018-08-22: 1 via TOPICAL
  Filled 2018-08-22: qty 22

## 2018-08-22 MED ORDER — VANCOMYCIN HCL IN DEXTROSE 1-5 GM/200ML-% IV SOLN
INTRAVENOUS | Status: AC
  Filled 2018-08-22: qty 200

## 2018-08-22 MED ORDER — FENTANYL CITRATE (PF) 100 MCG/2ML IJ SOLN
INTRAMUSCULAR | Status: DC | PRN
  Administered 2018-08-22: 25 ug via INTRAVENOUS
  Administered 2018-08-22: 12.5 ug via INTRAVENOUS
  Administered 2018-08-22: 25 ug via INTRAVENOUS

## 2018-08-22 MED ORDER — SODIUM CHLORIDE 0.9 % IV SOLN
INTRAVENOUS | Status: DC
  Administered 2018-08-22: 11:00:00 via INTRAVENOUS

## 2018-08-22 MED ORDER — LIDOCAINE HCL 1 % IJ SOLN
INTRAMUSCULAR | Status: AC
  Filled 2018-08-22: qty 60

## 2018-08-22 MED ORDER — MIDAZOLAM HCL 5 MG/5ML IJ SOLN
INTRAMUSCULAR | Status: DC | PRN
  Administered 2018-08-22 (×3): 1 mg via INTRAVENOUS
  Administered 2018-08-22 (×2): 2 mg via INTRAVENOUS

## 2018-08-22 MED ORDER — FENTANYL CITRATE (PF) 100 MCG/2ML IJ SOLN
INTRAMUSCULAR | Status: AC
  Filled 2018-08-22: qty 2

## 2018-08-22 MED ORDER — ONDANSETRON HCL 4 MG/2ML IJ SOLN: 4.0000 mg | Freq: Four times a day (QID) | INTRAMUSCULAR | Status: DC | PRN

## 2018-08-22 MED ORDER — SODIUM CHLORIDE 0.9 % IV SOLN: 250.0000 mL | INTRAVENOUS | Status: DC | PRN

## 2018-08-22 SURGICAL SUPPLY — 5 items
CABLE SURGICAL S-101-97-12 (CABLE) ×2 IMPLANT
IPG PACE AZUR XT DR MRI W1DR01 (Pacemaker) IMPLANT
PACE AZURE XT DR MRI W1DR01 (Pacemaker) ×2 IMPLANT
PAD PRO RADIOLUCENT 2001M-C (PAD) ×2 IMPLANT
TRAY PACEMAKER INSERTION (PACKS) ×2 IMPLANT

## 2018-08-22 NOTE — H&P (Signed)
  PCP: Patient, No Pcp Per   Primary EP:  Dr Johney Frame  Lori Gay is a 58 y.o. female who presents today for pacemaker generator change.  Since last being seen in our clinic, the patient reports doing very well.  By remote, her pacemaker has reached ERI battery status.  Today, she denies symptoms of palpitations, chest pain, shortness of breath,  lower extremity edema, dizziness, presyncope, or syncope.  Her primary concern is with fatigue.  This has been a chronic issue. The patient is otherwise without complaint today.  echo 9/19 revealed normal EF      Past Medical History:  Diagnosis Date  . AVNRT (AV nodal re-entry tachycardia) (HCC)    s/p slow pathway ablation by Dr Johney Frame complicated by AV block requiring PPM  . DDD (degenerative disc disease)    back surgery x2  . Second degree AV block    s/p MDT REVO pacemaker by Dr Graciela Husbands  . Tobacco user         Past Surgical History:  Procedure Laterality Date  . ATRIAL ABLATION SURGERY  10/11   slow pathway; for inducible AVNRT  . PACEMAKER INSERTION  11/11   for AV block post ablation; Medtronic Revo by Dr Graciela Husbands  . TONSILLECTOMY      ROS- all systems are reviewed and negative except as per HPI above        Current Outpatient Medications  Medication Sig Dispense Refill  . cetirizine (ZYRTEC) 10 MG tablet Take 10 mg by mouth daily as needed for allergies or rhinitis.     . clonazePAM (KLONOPIN) 1 MG tablet Take 1 tablet by mouth as needed for anxiety.    . NON FORMULARY Take 1 capsule by mouth daily as needed (vitamin supplement). Multi-B    . Probiotic Product (PROBIOTIC PO) Take 1 capsule by mouth daily as needed (digestive support).    . rosuvastatin (CRESTOR) 5 MG tablet Take 1 tablet (5 mg total) by mouth daily. 90 tablet 3   No current facility-administered medications for this visit.     Physical Exam:    Vitals:   04/25/18 0808  BP: 116/74  Pulse: 66  SpO2: 99%  Weight: 174 lb (78.9  kg)  Height: 5\' 5"  (1.651 m)    GEN- The patient is well appearing, alert and oriented x 3 today.   Head- normocephalic, atraumatic Eyes-  Sclera clear, conjunctiva pink Ears- hearing intact Oropharynx- clear Lungs- Clear to ausculation bilaterally, normal work of breathing Chest- pacemaker pocket is well healed Heart- Regular rate and rhythm, no murmurs, rubs or gallops, PMI not laterally displaced GI- soft, NT, ND, + BS Extremities- no clubbing, cyanosis, or edema   Assessment and Plan:  1. Symptomatic second degree heart block Normal pacemaker function See Pace Art report No changes today She has reached ERI battery status. Risks, benefits, and alternatives to pacemaker pulse generator replacement were discussed in detail today.  The patient understands that risks include but are not limited to bleeding, infection, pneumothorax, perforation, tamponade, vascular damage, renal failure, MI, stroke, death, damage to his existing leads, and lead dislodgement and wishes to proceed at this time.  Hillis Range MD, [facility] 08/22/2018 10:52 AM

## 2018-08-22 NOTE — Discharge Instructions (Signed)
Return to Work ________Lisa Tysinger___________________________________________ was treated at our facility. Injury or illness was: ___Work-related. __X_Not work-related. ___Undetermined if work-related. Return to work  Employee may return to work on ____1/28/20__________________.  Employee may return to modified work on ______________________. Work activity restrictions This person is not able to do the following activities: ___Bend ___Sit for a prolonged time  This person should not sit for more than ____ hours at a time.  This person should not sit for more than ____ hours during an 8-hour workday. _X__Lift more than ___5_____ lb ___Squat ___ Stand for a prolonged time  ___ This person should not stand for more than ____ hours at a time.  ___ This person should not stand for more than ____ hours during an 8-hour workday. ___Climb ___Reach ___Push and pull with the ___ right hand ___ left hand ___ Walk  ___ This person should not walk for more than ____ hours at a time.  ___ This person should not walk for more than ____ hours during an 8-hour workday. ___ Drive or operate a motor vehicle at work ___ [employer] with the ___ right hand ___ left hand ___Other _________________________________________________________________ These restrictions are effective until ____1/28/20__________________ or until a recheck appointment on _____1/28/20_________________. Health care provider name (printed): _________________________________________ Health care provider (signature): _________________________________________ Date: _________________________________________ How to use this form Show this Return to Work statement to your supervisor at work as soon as possible. Your employer should be aware of your condition and may be able to help with the necessary work activity restrictions. Contact your health care provider if:  You wish to return to work sooner than the date that is listed  above.  You have problems that make it difficult for you to return at that time. This information is not intended to replace advice given to you by your health care provider. Make sure you discuss any questions you have with your health care provider. Document Released: 07/24/2005 Document Revised: 07/19/2017 Document Reviewed: 07/19/2017 Elsevier Interactive Patient Education  2019 [employer] Pacemaker Battery Change, Care After This sheet gives you information about how to care for yourself after your procedure. Your health care provider may also give you more specific instructions. If you have problems or questions, contact your health care provider. What can I expect after the procedure? After your procedure, it is common to have:  Pain or soreness at the site where the pacemaker was inserted.  Swelling at the site where the pacemaker was inserted. Follow these instructions at home: Incision care   Keep the incision clean and dry. ? Do not take baths, swim, or use a hot tub until your health care provider approves. ? You may shower the day after your procedure, or as directed by your health care provider. ? Pat the area dry with a clean towel. Do not rub the area. This may cause bleeding.  Follow instructions from your health care provider about how to take care of your incision. Make sure you: ? Wash your hands with soap and water before you change your bandage (dressing). If soap and water are not available, use hand sanitizer. ? Change your dressing as told by your health care provider. ? Leave stitches (sutures), skin glue, or adhesive strips in place. These skin closures may need to stay in place for 2 weeks or longer. If adhesive strip edges start to loosen and curl up, you may trim the loose edges. Do not remove adhesive strips completely unless your health care provider tells you  to do that.  Check your incision area every day for signs of infection. Check for: ? More  redness, swelling, or pain. ? More fluid or blood. ? Warmth. ? Pus or a bad smell. Activity  Do not lift anything that is heavier than 10 lb (4.5 kg) until your health care provider says it is okay to do so.  For the first 2 weeks, or as long as told by your health care provider: ? Avoid lifting your left arm higher than your shoulder. ? Be gentle when you move your arms over your head. It is okay to raise your arm to comb your hair. ? Avoid strenuous exercise.  Ask your health care provider when it is okay to: ? Resume your normal activities. ? Return to work or school. ? Resume sexual activity. Eating and drinking  Eat a heart-healthy diet. This should include plenty of fresh fruits and vegetables, whole grains, low-fat dairy products, and lean protein like chicken and fish.  Limit alcohol intake to no more than 1 drink a day for non-pregnant women and 2 drinks a day for men. One drink equals 12 oz of beer, 5 oz of wine, or 1 oz of hard liquor.  Check ingredients and nutrition facts on packaged foods and beverages. Avoid the following types of food: ? Food that is high in salt (sodium). ? Food that is high in saturated fat, like full-fat dairy or red meat. ? Food that is high in trans fat, like fried food. ? Food and drinks that are high in sugar. Lifestyle  Do not use any products that contain nicotine or tobacco, such as cigarettes and e-cigarettes. If you need help quitting, ask your health care provider.  Take steps to manage and control your weight.  Get regular exercise. Aim for 150 minutes of moderate-intensity exercise (such as walking or yoga) or 75 minutes of vigorous exercise (such as running or swimming) each week.  Manage other health problems, such as diabetes or high blood pressure. Ask your health care provider how you can manage these conditions. General instructions  Do not drive for 24 hours after your procedure if you were given a medicine to help you  relax (sedative).  Take over-the-counter and prescription medicines only as told by your health care provider.  Avoid putting pressure on the area where the pacemaker was placed.  If you need an MRI after your pacemaker has been placed, be sure to tell the health care provider who orders the MRI that you have a pacemaker.  Avoid close and prolonged exposure to electrical devices that have strong magnetic fields. These include: ? Cell phones. Avoid keeping them in a pocket near the pacemaker, and try using the ear opposite the pacemaker. ? MP3 players. ? Household appliances, like microwaves. ? Metal detectors. ? Electric generators. ? High-tension wires.  Keep all follow-up visits as directed by your health care provider. This is important. Contact a health care provider if:  You have pain at the incision site that is not relieved by over-the-counter or prescription medicines.  You have any of these around your incision site or coming from it: ? More redness, swelling, or pain. ? Fluid or blood. ? Warmth to the touch. ? Pus or a bad smell.  You have a fever.  You feel brief, occasional palpitations, light-headedness, or any symptoms that you think might be related to your heart. Get help right away if:  You experience chest pain that is different from the pain  at the pacemaker site.  You develop a red streak that extends above or below the incision site.  You experience shortness of breath.  You have palpitations or an irregular heartbeat.  You have light-headedness that does not go away quickly.  You faint or have dizzy spells.  Your pulse suddenly drops or increases rapidly and does not return to normal.  You begin to gain weight and your legs and ankles swell. Summary  After your procedure, it is common to have pain, soreness, and some swelling where the pacemaker was inserted.  Make sure to keep your incision clean and dry. Follow instructions from your health  care provider about how to take care of your incision.  Check your incision every day for signs of infection, such as more pain or swelling, pus or a bad smell, warmth, or leaking fluid and blood.  Avoid strenuous exercise and lifting your left arm higher than your shoulder for 2 weeks, or as long as told by your health care provider. This information is not intended to replace advice given to you by your health care provider. Make sure you discuss any questions you have with your health care provider. Document Released: 05/14/2013 Document Revised: 06/15/2016 Document Reviewed: 06/15/2016 Elsevier Interactive Patient Education  2019 [employer].

## 2018-08-23 LAB — CUP PACEART REMOTE DEVICE CHECK
Battery Voltage: 2.81 V
Brady Statistic AP VP Percent: 8.33 %
Brady Statistic AP VS Percent: 0.01 %
Brady Statistic AS VP Percent: 89.78 %
Brady Statistic AS VS Percent: 1.89 %
Brady Statistic RA Percent Paced: 7.65 %
Brady Statistic RV Percent Paced: 97.28 %
Date Time Interrogation Session: 20191121124302
Implantable Pulse Generator Implant Date: 20111031
Lead Channel Impedance Value: 528 Ohm
Lead Channel Sensing Intrinsic Amplitude: 2.932 mV
Lead Channel Setting Pacing Amplitude: 2.5 V
Lead Channel Setting Sensing Sensitivity: 2.1 mV
MDC IDC MSMT LEADCHNL RA IMPEDANCE VALUE: 416 Ohm
MDC IDC MSMT LEADCHNL RV SENSING INTR AMPL: 20.041 mV
MDC IDC SET LEADCHNL RV PACING PULSEWIDTH: 0.8 ms

## 2018-08-23 MED FILL — Gentamicin Sulfate Inj 40 MG/ML: INTRAMUSCULAR | Qty: 80 | Status: AC

## 2018-08-23 MED FILL — Vancomycin HCl-Dextrose IV Soln 1 GM/200ML-5%: INTRAVENOUS | Qty: 200 | Status: AC

## 2018-09-02 ENCOUNTER — Ambulatory Visit (INDEPENDENT_AMBULATORY_CARE_PROVIDER_SITE_OTHER): Payer: Commercial Managed Care - PPO | Admitting: Nurse Practitioner

## 2018-09-02 DIAGNOSIS — I442 Atrioventricular block, complete: Secondary | ICD-10-CM

## 2018-09-02 LAB — CUP PACEART INCLINIC DEVICE CHECK
Date Time Interrogation Session: 20200127092937
Implantable Lead Implant Date: 20111031
Implantable Lead Implant Date: 20111031
Implantable Lead Location: 753859
Implantable Lead Location: 753860
Implantable Pulse Generator Implant Date: 20200116

## 2018-09-02 NOTE — Progress Notes (Signed)
Wound check appointment. Steri-strips removed. Wound without redness or edema. Incision edges approximated, wound well healed. Normal device function. Thresholds, sensing, and impedances consistent with implant measurements.  Histogram distribution appropriate for patient and level of activity. No mode switches or high ventricular rates noted. Patient educated about wound care, arm mobility, lifting restrictions. ROV in 3 months with implanting physician. 

## 2018-10-07 ENCOUNTER — Telehealth: Payer: Self-pay | Admitting: Internal Medicine

## 2018-10-07 NOTE — Telephone Encounter (Signed)
New message:     Patient calling concerning her pacer. Please call patient back.

## 2018-10-07 NOTE — Telephone Encounter (Signed)
Pt's orthopedic is requesting MRI to evaluate back issues. Her pacemaker system is MRI compatible. CXR checked, no abandoned leads. She will need device reprogramming at time of MRI as she is pacemaker dependent. I have advised that [facility] is performing MRI's on MRI conditional devices and they should be able to do. She will notify her orthopedic and have it scheduled.  Gypsy Balsam, NP 10/07/2018 1:45 PM

## 2018-10-11 ENCOUNTER — Other Ambulatory Visit: Payer: Self-pay | Admitting: Sports Medicine

## 2018-10-14 ENCOUNTER — Other Ambulatory Visit (HOSPITAL_COMMUNITY): Payer: Self-pay | Admitting: Sports Medicine

## 2018-10-14 DIAGNOSIS — M5416 Radiculopathy, lumbar region: Secondary | ICD-10-CM

## 2018-10-23 ENCOUNTER — Other Ambulatory Visit: Payer: Self-pay | Admitting: Internal Medicine

## 2018-10-25 ENCOUNTER — Encounter (HOSPITAL_COMMUNITY): Payer: Self-pay

## 2018-10-25 ENCOUNTER — Ambulatory Visit (HOSPITAL_COMMUNITY): Payer: Commercial Managed Care - PPO

## 2018-11-18 ENCOUNTER — Ambulatory Visit (INDEPENDENT_AMBULATORY_CARE_PROVIDER_SITE_OTHER): Payer: Commercial Managed Care - PPO | Admitting: *Deleted

## 2018-11-18 ENCOUNTER — Other Ambulatory Visit: Payer: Self-pay

## 2018-11-18 DIAGNOSIS — I442 Atrioventricular block, complete: Secondary | ICD-10-CM

## 2018-11-18 LAB — CUP PACEART REMOTE DEVICE CHECK
Battery Remaining Longevity: 149 mo
Battery Voltage: 3.18 V
Brady Statistic AP VP Percent: 14.11 %
Brady Statistic AP VS Percent: 0 %
Brady Statistic AS VP Percent: 85.87 %
Brady Statistic AS VS Percent: 0.02 %
Brady Statistic RA Percent Paced: 14.09 %
Brady Statistic RV Percent Paced: 99.98 %
Date Time Interrogation Session: 20200413154408
Implantable Lead Implant Date: 20111031
Implantable Lead Implant Date: 20111031
Implantable Lead Location: 753859
Implantable Lead Location: 753860
Implantable Pulse Generator Implant Date: 20200116
Lead Channel Impedance Value: 361 Ohm
Lead Channel Impedance Value: 418 Ohm
Lead Channel Impedance Value: 456 Ohm
Lead Channel Impedance Value: 532 Ohm
Lead Channel Pacing Threshold Amplitude: 0.5 V
Lead Channel Pacing Threshold Amplitude: 1 V
Lead Channel Pacing Threshold Pulse Width: 0.4 ms
Lead Channel Pacing Threshold Pulse Width: 0.4 ms
Lead Channel Sensing Intrinsic Amplitude: 22.875 mV
Lead Channel Sensing Intrinsic Amplitude: 22.875 mV
Lead Channel Sensing Intrinsic Amplitude: 3 mV
Lead Channel Sensing Intrinsic Amplitude: 3 mV
Lead Channel Setting Pacing Amplitude: 1.5 V
Lead Channel Setting Pacing Amplitude: 2 V
Lead Channel Setting Pacing Pulse Width: 0.4 ms
Lead Channel Setting Sensing Sensitivity: 2.8 mV

## 2018-11-21 ENCOUNTER — Telehealth: Payer: Self-pay | Admitting: Internal Medicine

## 2018-11-21 NOTE — Telephone Encounter (Signed)
STAT if HR is under 50 or over 120 (normal HR is 60-100 beats per minute)  1) What is your heart rate? She had some readings 105-her machine was working, but she could tell it-  2) Do you have a log of your heart rate readings (document readings)? no  3) Do you have any other symptoms? Pt  Think it might the new pain medicine she is taking for her back-she did not know the name of it- she wanted a sooner appt than 427-20- I made her one for 11-25-18

## 2018-11-25 ENCOUNTER — Telehealth: Payer: Commercial Managed Care - PPO | Admitting: Internal Medicine

## 2018-11-25 ENCOUNTER — Other Ambulatory Visit: Payer: Self-pay

## 2018-11-25 ENCOUNTER — Telehealth: Payer: Self-pay

## 2018-11-25 ENCOUNTER — Telehealth: Payer: Self-pay | Admitting: Internal Medicine

## 2018-11-25 NOTE — Telephone Encounter (Signed)
Patient returned your call.

## 2018-11-25 NOTE — Telephone Encounter (Signed)
Error

## 2018-11-25 NOTE — Progress Notes (Deleted)
Pt did not answer Reschedule visit for another day

## 2018-11-25 NOTE — Telephone Encounter (Signed)
Left message regarding appt on 11/25/18. 

## 2018-11-25 NOTE — Telephone Encounter (Signed)
New Message    Pt is returning call for her appt   Please call her back

## 2018-11-28 ENCOUNTER — Encounter: Payer: Self-pay | Admitting: Cardiology

## 2018-11-28 NOTE — Progress Notes (Signed)
Remote pacemaker transmission.   

## 2018-12-02 ENCOUNTER — Encounter: Payer: Commercial Managed Care - PPO | Admitting: Internal Medicine

## 2018-12-20 ENCOUNTER — Ambulatory Visit (HOSPITAL_COMMUNITY)
Admission: RE | Admit: 2018-12-20 | Discharge: 2018-12-20 | Disposition: A | Discharge status: Home / Self Care | Payer: Commercial Managed Care - PPO | Source: Ambulatory Visit | Attending: Sports Medicine | Admitting: Sports Medicine

## 2018-12-20 ENCOUNTER — Other Ambulatory Visit: Payer: Self-pay

## 2018-12-20 DIAGNOSIS — M5416 Radiculopathy, lumbar region: Secondary | ICD-10-CM | POA: Diagnosis not present

## 2018-12-20 MED ORDER — GADOBUTROL 1 MMOL/ML IV SOLN
7.0000 mL | Freq: Once | INTRAVENOUS | Status: AC | PRN
  Administered 2018-12-20: 7 mL via INTRAVENOUS

## 2019-02-17 ENCOUNTER — Ambulatory Visit (INDEPENDENT_AMBULATORY_CARE_PROVIDER_SITE_OTHER): Payer: Commercial Managed Care - PPO | Admitting: *Deleted

## 2019-02-17 DIAGNOSIS — I441 Atrioventricular block, second degree: Secondary | ICD-10-CM

## 2019-02-17 LAB — CUP PACEART REMOTE DEVICE CHECK
Battery Remaining Longevity: 138 mo
Battery Voltage: 3.13 V
Brady Statistic AP VP Percent: 16.84 %
Brady Statistic AP VS Percent: 0 %
Brady Statistic AS VP Percent: 83.16 %
Brady Statistic AS VS Percent: 0 %
Brady Statistic RA Percent Paced: 16.82 %
Brady Statistic RV Percent Paced: 100 %
Date Time Interrogation Session: 20200712232003
Implantable Lead Implant Date: 20111031
Implantable Lead Implant Date: 20111031
Implantable Lead Location: 753859
Implantable Lead Location: 753860
Implantable Pulse Generator Implant Date: 20200116
Lead Channel Impedance Value: 380 Ohm
Lead Channel Impedance Value: 437 Ohm
Lead Channel Impedance Value: 475 Ohm
Lead Channel Impedance Value: 551 Ohm
Lead Channel Pacing Threshold Amplitude: 0.5 V
Lead Channel Pacing Threshold Amplitude: 1.125 V
Lead Channel Pacing Threshold Pulse Width: 0.4 ms
Lead Channel Pacing Threshold Pulse Width: 0.4 ms
Lead Channel Sensing Intrinsic Amplitude: 22.875 mV
Lead Channel Sensing Intrinsic Amplitude: 22.875 mV
Lead Channel Sensing Intrinsic Amplitude: 3.25 mV
Lead Channel Sensing Intrinsic Amplitude: 3.25 mV
Lead Channel Setting Pacing Amplitude: 1.5 V
Lead Channel Setting Pacing Amplitude: 2.25 V
Lead Channel Setting Pacing Pulse Width: 0.4 ms
Lead Channel Setting Sensing Sensitivity: 2.8 mV

## 2019-03-03 NOTE — Progress Notes (Signed)
Remote pacemaker transmission.   

## 2019-05-05 ENCOUNTER — Telehealth: Payer: Self-pay

## 2019-05-05 NOTE — Telephone Encounter (Signed)
Left a message regarding appt on 05/07/19. 

## 2019-05-06 NOTE — Telephone Encounter (Signed)
Patient returned call please call her at work number.

## 2019-05-06 NOTE — Telephone Encounter (Signed)
Return pts call to discuss her virtual appt with Dr Rayann Heman on 05/07/19. I left a message asking pt to give me a call.

## 2019-05-07 ENCOUNTER — Telehealth (INDEPENDENT_AMBULATORY_CARE_PROVIDER_SITE_OTHER): Payer: Commercial Managed Care - PPO | Admitting: Internal Medicine

## 2019-05-07 ENCOUNTER — Encounter: Payer: Self-pay | Admitting: Internal Medicine

## 2019-05-07 VITALS — BP 119/91 | HR 78 | Ht 65.0 in | Wt 163.0 lb

## 2019-05-07 DIAGNOSIS — I441 Atrioventricular block, second degree: Secondary | ICD-10-CM

## 2019-05-07 NOTE — Progress Notes (Signed)
Electrophysiology TeleHealth Note   Due to national recommendations of social distancing due to COVID 19, an audio/video telehealth visit is felt to be most appropriate for this patient at this time.  See MyChart message from today for the patient's consent to telehealth for [facility].  Date:  05/07/2019   ID:  Lori Gay, DOB 1960/11/19, MRN [id number]  Location: patient's home  Provider location:  [place]  Evaluation Performed: Follow-up visit  PCP:  Patient, No Pcp Per   Electrophysiologist:  Dr Johney Frame  Chief Complaint:  pacemaker  History of Present Illness:    Lori Gay is a 58 y.o. female who presents via telehealth conferencing today.  Since her recent PPM generator change, the patient reports doing very well.  Today, she denies symptoms of palpitations, chest pain, shortness of breath,  lower extremity edema, dizziness, presyncope, or syncope. She feels that at times her heart rates are a little elevated.  The patient is otherwise without complaint today.  The patient denies symptoms of fevers, chills, cough, or new SOB worrisome for COVID 19.  Past Medical History:  Diagnosis Date  . AVNRT (AV nodal re-entry tachycardia) (HCC)    s/p slow pathway ablation by Dr Johney Frame complicated by AV block requiring PPM  . DDD (degenerative disc disease)    back surgery x2  . Second degree AV block    s/p MDT REVO pacemaker by Dr Graciela Husbands  . Tobacco user     Past Surgical History:  Procedure Laterality Date  . ATRIAL ABLATION SURGERY  10/11   slow pathway; for inducible AVNRT  . PACEMAKER INSERTION  11/11   for AV block post ablation; Medtronic Revo by Dr Graciela Husbands  . PPM GENERATOR CHANGEOUT N/A 08/22/2018   Procedure: PPM GENERATOR CHANGEOUT;  Surgeon: Hillis Range, MD;  Location: [facility] INVASIVE CV LAB;  Service: Cardiovascular;  Laterality: N/A;  . TONSILLECTOMY      Current Outpatient Medications  Medication Sig Dispense Refill  . cetirizine (ZYRTEC) 10 MG  tablet Take 10 mg by mouth daily as needed for allergies or rhinitis.     . clonazePAM (KLONOPIN) 1 MG tablet Take 1 tablet by mouth 2 (two) times daily as needed for anxiety.     . gabapentin (NEURONTIN) 100 MG capsule Take 2 capsules by mouth 3 (three) times daily.    Marland Kitchen ibuprofen (ADVIL,MOTRIN) 200 MG tablet Take 400-800 mg by mouth every 8 (eight) hours as needed (pain).    . meloxicam (MOBIC) 15 MG tablet Take 15 mg by mouth as needed for muscle spasms.    . rosuvastatin (CRESTOR) 5 MG tablet TAKE 1 TABLET DAILY 90 tablet 1  . tiZANidine (ZANAFLEX) 4 MG tablet Take 4 mg by mouth every 8 (eight) hours as needed for muscle spasms.    . traMADol (ULTRAM) 50 MG tablet Take 1 tablet by mouth as needed for pain.     No current facility-administered medications for this visit.     Allergies:   Loratadine and Penicillins   Social History:  The patient  reports that she quit smoking about 4 years ago. Her smoking use included cigarettes. She started smoking about 19 years ago. She smoked 1.00 pack per day. She has never used smokeless tobacco. She reports current alcohol use.   Family History:  The patient's family history includes Diabetes in her mother and unknown relative; Healthy in her brother; Hypertension in her father and mother.   ROS:  Please see the history of present  illness.   All other systems are personally reviewed and negative.    Exam:    Vital Signs:  BP (!) 119/91   Pulse 78   Ht 5\' 5"  (1.651 m)   Wt 163 lb (73.9 kg)   BMI 27.12 kg/m   Well sounding and appearing, alert and conversant, regular work of breathing,  good skin color Eyes- anicteric, neuro- grossly intact, skin- no apparent rash or lesions or cyanosis, mouth- oral mucosa is pink  Labs/Other Tests and Data Reviewed:    Recent Labs: 08/02/2018: BUN 13; Creatinine, Ser 0.86; Hemoglobin 14.3; Platelets 204; Potassium 5.1; Sodium 137   Wt Readings from Last 3 Encounters:  05/07/19 163 lb (73.9 kg)   08/22/18 170 lb (77.1 kg)  04/25/18 174 lb (78.9 kg)     Last device remote is reviewed from [facility] PDF which reveals normal device function, no arrhythmias    ASSESSMENT & PLAN:    1.  Second degree AV block Normal pacemaker function by remotes Remotes are uptodate She has occasional elevations in heart rate clinically, but histograms seem ok.  2. HL Stable No change required today  3. Tobacco She remains quit  Follow-up:  carelink Return to see me in a year   Patient Risk:  after full review of this patients clinical status, I feel that they are at moderate risk at this time.  Today, I have spent 15 minutes with the patient with telehealth technology discussing arrhythmia management .    Army Fossa, MD  05/07/2019 2:29 PM     [facility] [address] [place] [address] [phone] (office) [phone] (fax)

## 2019-05-20 ENCOUNTER — Ambulatory Visit (INDEPENDENT_AMBULATORY_CARE_PROVIDER_SITE_OTHER): Payer: Commercial Managed Care - PPO | Admitting: *Deleted

## 2019-05-20 DIAGNOSIS — I441 Atrioventricular block, second degree: Secondary | ICD-10-CM

## 2019-05-20 LAB — CUP PACEART REMOTE DEVICE CHECK
Battery Remaining Longevity: 143 mo
Battery Voltage: 3.07 V
Brady Statistic AP VP Percent: 18.94 %
Brady Statistic AP VS Percent: 0 %
Brady Statistic AS VP Percent: 81.05 %
Brady Statistic AS VS Percent: 0.01 %
Brady Statistic RA Percent Paced: 18.92 %
Brady Statistic RV Percent Paced: 99.99 %
Date Time Interrogation Session: 20201013004228
Implantable Lead Implant Date: 20111031
Implantable Lead Implant Date: 20111031
Implantable Lead Location: 753859
Implantable Lead Location: 753860
Implantable Pulse Generator Implant Date: 20200116
Lead Channel Impedance Value: 380 Ohm
Lead Channel Impedance Value: 418 Ohm
Lead Channel Impedance Value: 475 Ohm
Lead Channel Impedance Value: 532 Ohm
Lead Channel Pacing Threshold Amplitude: 0.5 V
Lead Channel Pacing Threshold Amplitude: 1 V
Lead Channel Pacing Threshold Pulse Width: 0.4 ms
Lead Channel Pacing Threshold Pulse Width: 0.4 ms
Lead Channel Sensing Intrinsic Amplitude: 24.25 mV
Lead Channel Sensing Intrinsic Amplitude: 24.25 mV
Lead Channel Sensing Intrinsic Amplitude: 3 mV
Lead Channel Sensing Intrinsic Amplitude: 3 mV
Lead Channel Setting Pacing Amplitude: 1.5 V
Lead Channel Setting Pacing Amplitude: 2 V
Lead Channel Setting Pacing Pulse Width: 0.4 ms
Lead Channel Setting Sensing Sensitivity: 2.8 mV

## 2019-06-02 NOTE — Progress Notes (Signed)
Remote pacemaker transmission.   

## 2019-07-29 ENCOUNTER — Other Ambulatory Visit: Payer: Self-pay | Admitting: Internal Medicine

## 2019-08-19 ENCOUNTER — Ambulatory Visit (INDEPENDENT_AMBULATORY_CARE_PROVIDER_SITE_OTHER): Payer: Commercial Managed Care - PPO | Admitting: *Deleted

## 2019-08-19 DIAGNOSIS — I441 Atrioventricular block, second degree: Secondary | ICD-10-CM

## 2019-08-19 LAB — CUP PACEART REMOTE DEVICE CHECK
Battery Remaining Longevity: 131 mo
Battery Voltage: 3.04 V
Brady Statistic AP VP Percent: 12.74 %
Brady Statistic AP VS Percent: 0 %
Brady Statistic AS VP Percent: 87.25 %
Brady Statistic AS VS Percent: 0.01 %
Brady Statistic RA Percent Paced: 12.73 %
Brady Statistic RV Percent Paced: 99.99 %
Date Time Interrogation Session: 20210111230430
Implantable Lead Implant Date: 20111031
Implantable Lead Implant Date: 20111031
Implantable Lead Location: 753859
Implantable Lead Location: 753860
Implantable Pulse Generator Implant Date: 20200116
Lead Channel Impedance Value: 361 Ohm
Lead Channel Impedance Value: 418 Ohm
Lead Channel Impedance Value: 475 Ohm
Lead Channel Impedance Value: 532 Ohm
Lead Channel Pacing Threshold Amplitude: 0.5 V
Lead Channel Pacing Threshold Amplitude: 1.125 V
Lead Channel Pacing Threshold Pulse Width: 0.4 ms
Lead Channel Pacing Threshold Pulse Width: 0.4 ms
Lead Channel Sensing Intrinsic Amplitude: 24.5 mV
Lead Channel Sensing Intrinsic Amplitude: 24.5 mV
Lead Channel Sensing Intrinsic Amplitude: 3.25 mV
Lead Channel Sensing Intrinsic Amplitude: 3.25 mV
Lead Channel Setting Pacing Amplitude: 1.5 V
Lead Channel Setting Pacing Amplitude: 2.25 V
Lead Channel Setting Pacing Pulse Width: 0.4 ms
Lead Channel Setting Sensing Sensitivity: 2.8 mV

## 2019-11-18 ENCOUNTER — Ambulatory Visit (INDEPENDENT_AMBULATORY_CARE_PROVIDER_SITE_OTHER): Payer: Commercial Managed Care - PPO | Admitting: *Deleted

## 2019-11-18 DIAGNOSIS — I441 Atrioventricular block, second degree: Secondary | ICD-10-CM

## 2019-11-18 LAB — CUP PACEART REMOTE DEVICE CHECK
Battery Remaining Longevity: 128 mo
Battery Voltage: 3.03 V
Brady Statistic AP VP Percent: 9.03 %
Brady Statistic AP VS Percent: 0 %
Brady Statistic AS VP Percent: 90.97 %
Brady Statistic AS VS Percent: 0 %
Brady Statistic RA Percent Paced: 9.01 %
Brady Statistic RV Percent Paced: 100 %
Date Time Interrogation Session: 20210412235648
Implantable Lead Implant Date: 20111031
Implantable Lead Implant Date: 20111031
Implantable Lead Location: 753859
Implantable Lead Location: 753860
Implantable Pulse Generator Implant Date: 20200116
Lead Channel Impedance Value: 361 Ohm
Lead Channel Impedance Value: 456 Ohm
Lead Channel Impedance Value: 456 Ohm
Lead Channel Impedance Value: 532 Ohm
Lead Channel Pacing Threshold Amplitude: 0.5 V
Lead Channel Pacing Threshold Amplitude: 1.125 V
Lead Channel Pacing Threshold Pulse Width: 0.4 ms
Lead Channel Pacing Threshold Pulse Width: 0.4 ms
Lead Channel Sensing Intrinsic Amplitude: 21.625 mV
Lead Channel Sensing Intrinsic Amplitude: 21.625 mV
Lead Channel Sensing Intrinsic Amplitude: 3.125 mV
Lead Channel Sensing Intrinsic Amplitude: 3.125 mV
Lead Channel Setting Pacing Amplitude: 1.5 V
Lead Channel Setting Pacing Amplitude: 2.25 V
Lead Channel Setting Pacing Pulse Width: 0.4 ms
Lead Channel Setting Sensing Sensitivity: 2.8 mV

## 2019-11-19 NOTE — Progress Notes (Signed)
PPM Remote  

## 2020-02-17 ENCOUNTER — Ambulatory Visit (INDEPENDENT_AMBULATORY_CARE_PROVIDER_SITE_OTHER): Payer: Commercial Managed Care - PPO | Admitting: *Deleted

## 2020-02-17 DIAGNOSIS — I441 Atrioventricular block, second degree: Secondary | ICD-10-CM | POA: Diagnosis not present

## 2020-02-17 LAB — CUP PACEART REMOTE DEVICE CHECK
Battery Remaining Longevity: 111 mo
Battery Voltage: 3.02 V
Brady Statistic AP VP Percent: 18.01 %
Brady Statistic AP VS Percent: 0 %
Brady Statistic AS VP Percent: 81.98 %
Brady Statistic AS VS Percent: 0.01 %
Brady Statistic RA Percent Paced: 18 %
Brady Statistic RV Percent Paced: 99.99 %
Date Time Interrogation Session: 20210712201021
Implantable Lead Implant Date: 20111031
Implantable Lead Implant Date: 20111031
Implantable Lead Location: 753859
Implantable Lead Location: 753860
Implantable Pulse Generator Implant Date: 20200116
Lead Channel Impedance Value: 361 Ohm
Lead Channel Impedance Value: 456 Ohm
Lead Channel Impedance Value: 456 Ohm
Lead Channel Impedance Value: 532 Ohm
Lead Channel Pacing Threshold Amplitude: 0.625 V
Lead Channel Pacing Threshold Amplitude: 1.125 V
Lead Channel Pacing Threshold Pulse Width: 0.4 ms
Lead Channel Pacing Threshold Pulse Width: 0.4 ms
Lead Channel Sensing Intrinsic Amplitude: 2.875 mV
Lead Channel Sensing Intrinsic Amplitude: 2.875 mV
Lead Channel Sensing Intrinsic Amplitude: 28 mV
Lead Channel Sensing Intrinsic Amplitude: 28 mV
Lead Channel Setting Pacing Amplitude: 1.5 V
Lead Channel Setting Pacing Amplitude: 2.75 V
Lead Channel Setting Pacing Pulse Width: 0.4 ms
Lead Channel Setting Sensing Sensitivity: 2.8 mV

## 2020-02-18 NOTE — Progress Notes (Signed)
Remote pacemaker transmission.   

## 2020-04-08 ENCOUNTER — Encounter: Payer: Self-pay | Admitting: Internal Medicine

## 2020-04-08 ENCOUNTER — Ambulatory Visit: Payer: Commercial Managed Care - PPO | Admitting: Internal Medicine

## 2020-04-08 ENCOUNTER — Ambulatory Visit (HOSPITAL_COMMUNITY)
Admission: RE | Admit: 2020-04-08 | Discharge: 2020-04-08 | Disposition: A | Discharge status: Home / Self Care | Payer: Commercial Managed Care - PPO | Source: Ambulatory Visit | Attending: Internal Medicine | Admitting: Internal Medicine

## 2020-04-08 ENCOUNTER — Other Ambulatory Visit: Payer: Self-pay

## 2020-04-08 VITALS — BP 126/66 | HR 67 | Ht 65.0 in | Wt 163.0 lb

## 2020-04-08 DIAGNOSIS — I441 Atrioventricular block, second degree: Secondary | ICD-10-CM | POA: Diagnosis not present

## 2020-04-08 DIAGNOSIS — Z950 Presence of cardiac pacemaker: Secondary | ICD-10-CM

## 2020-04-08 DIAGNOSIS — I872 Venous insufficiency (chronic) (peripheral): Secondary | ICD-10-CM

## 2020-04-08 LAB — CUP PACEART INCLINIC DEVICE CHECK
Battery Remaining Longevity: 102 mo
Battery Voltage: 3.01 V
Brady Statistic AP VP Percent: 14.6 %
Brady Statistic AP VS Percent: 0 %
Brady Statistic AS VP Percent: 85.4 %
Brady Statistic AS VS Percent: 0.01 %
Brady Statistic RA Percent Paced: 14.58 %
Brady Statistic RV Percent Paced: 99.99 %
Date Time Interrogation Session: 20210902111931
Implantable Lead Implant Date: 20111031
Implantable Lead Implant Date: 20111031
Implantable Lead Location: 753859
Implantable Lead Location: 753860
Implantable Pulse Generator Implant Date: 20200116
Lead Channel Impedance Value: 342 Ohm
Lead Channel Impedance Value: 418 Ohm
Lead Channel Impedance Value: 437 Ohm
Lead Channel Impedance Value: 475 Ohm
Lead Channel Pacing Threshold Amplitude: 0.5 V
Lead Channel Pacing Threshold Amplitude: 1.25 V
Lead Channel Pacing Threshold Pulse Width: 0.4 ms
Lead Channel Pacing Threshold Pulse Width: 0.6 ms
Lead Channel Sensing Intrinsic Amplitude: 19.875 mV
Lead Channel Sensing Intrinsic Amplitude: 3.25 mV
Lead Channel Setting Pacing Amplitude: 1.5 V
Lead Channel Setting Pacing Amplitude: 2.5 V
Lead Channel Setting Pacing Pulse Width: 0.6 ms
Lead Channel Setting Sensing Sensitivity: 2.8 mV

## 2020-04-08 NOTE — Progress Notes (Signed)
PCP: Patient, No Pcp Per   Primary EP:  Dr Johney Frame  Lori Gay is a 59 y.o. female who presents today for routine electrophysiology followup.  Since last being seen in our clinic, the patient reports doing very well.  Today, she denies symptoms of palpitations, exertional chest pain, shortness of breath,  lower extremity edema, dizziness, presyncope, or syncope.  The patient is otherwise without complaint today.   Past Medical History:  Diagnosis Date  . AVNRT (AV nodal re-entry tachycardia) (HCC)    s/p slow pathway ablation by Dr Johney Frame complicated by AV block requiring PPM  . DDD (degenerative disc disease)    back surgery x2  . Second degree AV block    s/p MDT REVO pacemaker by Dr Graciela Husbands  . Tobacco user    Past Surgical History:  Procedure Laterality Date  . ATRIAL ABLATION SURGERY  10/11   slow pathway; for inducible AVNRT  . PACEMAKER INSERTION  11/11   for AV block post ablation; Medtronic Revo by Dr Graciela Husbands  . PPM GENERATOR CHANGEOUT N/A 08/22/2018   Procedure: PPM GENERATOR CHANGEOUT;  Surgeon: Hillis Range, MD;  [facility];  Service: Cardiovascular;  Laterality: N/A;  . TONSILLECTOMY      ROS- all systems are reviewed and negative except as per HPI above  Current Outpatient Medications  Medication Sig Dispense Refill  . cetirizine (ZYRTEC) 10 MG tablet Take 10 mg by mouth daily as needed for allergies or rhinitis.     . clonazePAM (KLONOPIN) 1 MG tablet Take 1 tablet by mouth 2 (two) times daily as needed for anxiety.     Marland Kitchen ibuprofen (ADVIL,MOTRIN) 200 MG tablet Take 400-800 mg by mouth every 8 (eight) hours as needed (pain).    . naproxen (NAPROSYN) 500 MG tablet Take 500 mg by mouth 2 (two) times daily.    . rosuvastatin (CRESTOR) 5 MG tablet TAKE 1 TABLET DAILY 90 tablet 3  . tiZANidine (ZANAFLEX) 4 MG tablet Take 4 mg by mouth every 8 (eight) hours as needed for muscle spasms.     No current facility-administered medications for this visit.      Physical Exam: Vitals:   04/08/20 1108  BP: 126/66  Pulse: 67  SpO2: 97%  Weight: 163 lb (73.9 kg)  Height: 5\' 5"  (1.651 m)    GEN- The patient is well appearing, alert and oriented x 3 today.   Head- normocephalic, atraumatic Eyes-  Sclera clear, conjunctiva pink Ears- hearing intact Oropharynx- clear Lungs- Clear to ausculation bilaterally, normal work of breathing Chest- pacemaker pocket is well healed Heart- Regular rate and rhythm, no murmurs, rubs or gallops, PMI not laterally displaced GI- soft, NT, ND, + BS Extremities- no clubbing, cyanosis, + L leg vericosities, edema, and a prominent medial ulceration  Pacemaker interrogation- reviewed in detail today,  See PACEART report  ekg tracing ordered today is personally reviewed and shows sinus with V pacing  Assessment and Plan:  1. Symptomatic second degree heart block Normal pacemaker function See Pace Art report RV threshold slightly elevated RV output increased she is not device dependant today  2. HL Stable No change required today  3. Venous insufficiency/ L leg edema with ulceration I worry about her wound Will order doppler to exclude DVT Refer to vein speciality group Discussed support hose today  4. Atypical chest pain She feels that this is well controlled I have offered stress testing today and she declines  Risks, benefits and potential toxicities  for medications prescribed and/or refilled reviewed with patient today.   Return in a year  Hillis Range MD, [facility] 04/08/2020 11:39 AM

## 2020-04-08 NOTE — Patient Instructions (Addendum)
Medication Instructions:  Your physician recommends that you continue on your current medications as directed. Please refer to the Current Medication list given to you today.  Labwork: None ordered.  Testing/Procedures:  You need a left leg vascular ultrasound.  This is done at the [facility] office.   Follow-Up: Your physician wants you to follow-up in: one year with Dr. Johney Frame.   You will receive a reminder letter in the mail two months in advance. If you don't receive a letter, please call our office to schedule the follow-up appointment.  Remote monitoring is used to monitor your Pacemaker from home. This monitoring reduces the number of office visits required to check your device to one time per year. It allows [place] to keep an eye on the functioning of your device to ensure it is working properly. You are scheduled for a device check from home on 05/18/2020. You may send your transmission at any time that day. If you have a wireless device, the transmission will be sent automatically. After your physician reviews your transmission, you will receive a postcard with your next transmission date.  Any Other Special Instructions Will Be Listed Below (If Applicable).  If you need a refill on your cardiac medications before your next appointment, please call your pharmacy.

## 2020-05-03 ENCOUNTER — Encounter: Payer: Commercial Managed Care - PPO | Admitting: Internal Medicine

## 2020-05-18 ENCOUNTER — Ambulatory Visit (INDEPENDENT_AMBULATORY_CARE_PROVIDER_SITE_OTHER): Payer: Commercial Managed Care - PPO

## 2020-05-18 DIAGNOSIS — I441 Atrioventricular block, second degree: Secondary | ICD-10-CM | POA: Diagnosis not present

## 2020-05-18 LAB — CUP PACEART REMOTE DEVICE CHECK
Battery Remaining Longevity: 103 mo
Battery Voltage: 3.01 V
Brady Statistic AP VP Percent: 12.77 %
Brady Statistic AP VS Percent: 0 %
Brady Statistic AS VP Percent: 87.22 %
Brady Statistic AS VS Percent: 0 %
Brady Statistic RA Percent Paced: 12.75 %
Brady Statistic RV Percent Paced: 100 %
Date Time Interrogation Session: 20211012050701
Implantable Lead Implant Date: 20111031
Implantable Lead Implant Date: 20111031
Implantable Lead Location: 753859
Implantable Lead Location: 753860
Implantable Pulse Generator Implant Date: 20200116
Lead Channel Impedance Value: 342 Ohm
Lead Channel Impedance Value: 437 Ohm
Lead Channel Impedance Value: 437 Ohm
Lead Channel Impedance Value: 513 Ohm
Lead Channel Pacing Threshold Amplitude: 0.625 V
Lead Channel Pacing Threshold Amplitude: 1.625 V
Lead Channel Pacing Threshold Pulse Width: 0.4 ms
Lead Channel Pacing Threshold Pulse Width: 0.4 ms
Lead Channel Sensing Intrinsic Amplitude: 19.875 mV
Lead Channel Sensing Intrinsic Amplitude: 2.625 mV
Lead Channel Sensing Intrinsic Amplitude: 2.625 mV
Lead Channel Sensing Intrinsic Amplitude: 28 mV
Lead Channel Setting Pacing Amplitude: 1.5 V
Lead Channel Setting Pacing Amplitude: 2.5 V
Lead Channel Setting Pacing Pulse Width: 0.6 ms
Lead Channel Setting Sensing Sensitivity: 2.8 mV

## 2020-05-21 NOTE — Progress Notes (Signed)
Remote pacemaker transmission.   

## 2020-06-08 ENCOUNTER — Other Ambulatory Visit: Payer: Self-pay | Admitting: Obstetrics and Gynecology

## 2020-06-08 DIAGNOSIS — R928 Other abnormal and inconclusive findings on diagnostic imaging of breast: Secondary | ICD-10-CM

## 2020-06-16 ENCOUNTER — Ambulatory Visit
Admission: RE | Admit: 2020-06-16 | Discharge: 2020-06-16 | Disposition: A | Discharge status: Home / Self Care | Payer: Commercial Managed Care - PPO | Source: Ambulatory Visit | Attending: Obstetrics and Gynecology | Admitting: Obstetrics and Gynecology

## 2020-06-16 ENCOUNTER — Ambulatory Visit: Admission: RE | Admit: 2020-06-16 | Payer: Commercial Managed Care - PPO | Source: Ambulatory Visit

## 2020-06-16 ENCOUNTER — Other Ambulatory Visit: Payer: Self-pay

## 2020-06-16 DIAGNOSIS — R928 Other abnormal and inconclusive findings on diagnostic imaging of breast: Secondary | ICD-10-CM

## 2020-07-27 ENCOUNTER — Other Ambulatory Visit: Payer: Self-pay | Admitting: Internal Medicine

## 2020-08-17 ENCOUNTER — Ambulatory Visit (INDEPENDENT_AMBULATORY_CARE_PROVIDER_SITE_OTHER): Payer: Managed Care, Other (non HMO)

## 2020-08-17 DIAGNOSIS — I442 Atrioventricular block, complete: Secondary | ICD-10-CM | POA: Diagnosis not present

## 2020-08-18 LAB — CUP PACEART REMOTE DEVICE CHECK
Battery Remaining Longevity: 101 mo
Battery Voltage: 3.01 V
Brady Statistic AP VP Percent: 10.93 %
Brady Statistic AP VS Percent: 0 %
Brady Statistic AS VP Percent: 89.07 %
Brady Statistic AS VS Percent: 0 %
Brady Statistic RA Percent Paced: 10.91 %
Brady Statistic RV Percent Paced: 100 %
Date Time Interrogation Session: 20220110210215
Implantable Lead Implant Date: 20111031
Implantable Lead Implant Date: 20111031
Implantable Lead Location: 753859
Implantable Lead Location: 753860
Implantable Pulse Generator Implant Date: 20200116
Lead Channel Impedance Value: 380 Ohm
Lead Channel Impedance Value: 437 Ohm
Lead Channel Impedance Value: 456 Ohm
Lead Channel Impedance Value: 532 Ohm
Lead Channel Pacing Threshold Amplitude: 0.5 V
Lead Channel Pacing Threshold Amplitude: 1.125 V
Lead Channel Pacing Threshold Pulse Width: 0.4 ms
Lead Channel Pacing Threshold Pulse Width: 0.4 ms
Lead Channel Sensing Intrinsic Amplitude: 19.875 mV
Lead Channel Sensing Intrinsic Amplitude: 2.625 mV
Lead Channel Sensing Intrinsic Amplitude: 2.625 mV
Lead Channel Sensing Intrinsic Amplitude: 28 mV
Lead Channel Setting Pacing Amplitude: 1.5 V
Lead Channel Setting Pacing Amplitude: 2.5 V
Lead Channel Setting Pacing Pulse Width: 0.6 ms
Lead Channel Setting Sensing Sensitivity: 2.8 mV

## 2020-09-03 NOTE — Progress Notes (Signed)
Remote pacemaker transmission.   

## 2020-11-16 ENCOUNTER — Ambulatory Visit (INDEPENDENT_AMBULATORY_CARE_PROVIDER_SITE_OTHER): Payer: Managed Care, Other (non HMO)

## 2020-11-16 DIAGNOSIS — I442 Atrioventricular block, complete: Secondary | ICD-10-CM

## 2020-11-16 LAB — CUP PACEART REMOTE DEVICE CHECK
Battery Remaining Longevity: 97 mo
Battery Voltage: 3 V
Brady Statistic AP VP Percent: 15.53 %
Brady Statistic AP VS Percent: 0 %
Brady Statistic AS VP Percent: 84.44 %
Brady Statistic AS VS Percent: 0.03 %
Brady Statistic RA Percent Paced: 15.52 %
Brady Statistic RV Percent Paced: 99.97 %
Date Time Interrogation Session: 20220412002732
Implantable Lead Implant Date: 20111031
Implantable Lead Implant Date: 20111031
Implantable Lead Location: 753859
Implantable Lead Location: 753860
Implantable Pulse Generator Implant Date: 20200116
Lead Channel Impedance Value: 399 Ohm
Lead Channel Impedance Value: 399 Ohm
Lead Channel Impedance Value: 475 Ohm
Lead Channel Impedance Value: 513 Ohm
Lead Channel Pacing Threshold Amplitude: 0.5 V
Lead Channel Pacing Threshold Amplitude: 1.625 V
Lead Channel Pacing Threshold Pulse Width: 0.4 ms
Lead Channel Pacing Threshold Pulse Width: 0.4 ms
Lead Channel Sensing Intrinsic Amplitude: 22.5 mV
Lead Channel Sensing Intrinsic Amplitude: 22.5 mV
Lead Channel Sensing Intrinsic Amplitude: 3.125 mV
Lead Channel Sensing Intrinsic Amplitude: 3.125 mV
Lead Channel Setting Pacing Amplitude: 1.5 V
Lead Channel Setting Pacing Amplitude: 2.5 V
Lead Channel Setting Pacing Pulse Width: 0.6 ms
Lead Channel Setting Sensing Sensitivity: 2.8 mV

## 2020-12-01 NOTE — Progress Notes (Signed)
Remote pacemaker transmission.   

## 2021-02-15 ENCOUNTER — Ambulatory Visit (INDEPENDENT_AMBULATORY_CARE_PROVIDER_SITE_OTHER): Payer: Managed Care, Other (non HMO)

## 2021-02-15 DIAGNOSIS — I442 Atrioventricular block, complete: Secondary | ICD-10-CM | POA: Diagnosis not present

## 2021-02-15 LAB — CUP PACEART REMOTE DEVICE CHECK
Battery Remaining Longevity: 94 mo
Battery Voltage: 3 V
Brady Statistic AP VP Percent: 19.78 %
Brady Statistic AP VS Percent: 0.01 %
Brady Statistic AS VP Percent: 80.13 %
Brady Statistic AS VS Percent: 0.07 %
Brady Statistic RA Percent Paced: 19.81 %
Brady Statistic RV Percent Paced: 99.92 %
Date Time Interrogation Session: 20220711230223
Implantable Lead Implant Date: 20111031
Implantable Lead Implant Date: 20111031
Implantable Lead Location: 753859
Implantable Lead Location: 753860
Implantable Pulse Generator Implant Date: 20200116
Lead Channel Impedance Value: 380 Ohm
Lead Channel Impedance Value: 399 Ohm
Lead Channel Impedance Value: 456 Ohm
Lead Channel Impedance Value: 513 Ohm
Lead Channel Pacing Threshold Amplitude: 0.5 V
Lead Channel Pacing Threshold Amplitude: 2.25 V
Lead Channel Pacing Threshold Pulse Width: 0.4 ms
Lead Channel Pacing Threshold Pulse Width: 0.4 ms
Lead Channel Sensing Intrinsic Amplitude: 18.75 mV
Lead Channel Sensing Intrinsic Amplitude: 18.75 mV
Lead Channel Sensing Intrinsic Amplitude: 2.875 mV
Lead Channel Sensing Intrinsic Amplitude: 2.875 mV
Lead Channel Setting Pacing Amplitude: 1.5 V
Lead Channel Setting Pacing Amplitude: 2.5 V
Lead Channel Setting Pacing Pulse Width: 0.6 ms
Lead Channel Setting Sensing Sensitivity: 2.8 mV

## 2021-03-10 NOTE — Progress Notes (Signed)
Remote pacemaker transmission.   

## 2021-04-18 ENCOUNTER — Encounter: Payer: Managed Care, Other (non HMO) | Admitting: Internal Medicine

## 2021-04-18 DIAGNOSIS — I441 Atrioventricular block, second degree: Secondary | ICD-10-CM

## 2021-04-18 DIAGNOSIS — I872 Venous insufficiency (chronic) (peripheral): Secondary | ICD-10-CM

## 2021-04-18 DIAGNOSIS — E785 Hyperlipidemia, unspecified: Secondary | ICD-10-CM

## 2021-04-25 ENCOUNTER — Other Ambulatory Visit: Payer: Self-pay | Admitting: Internal Medicine

## 2021-05-11 ENCOUNTER — Other Ambulatory Visit: Payer: Self-pay

## 2021-05-11 ENCOUNTER — Ambulatory Visit (INDEPENDENT_AMBULATORY_CARE_PROVIDER_SITE_OTHER): Payer: Managed Care, Other (non HMO) | Admitting: Internal Medicine

## 2021-05-11 ENCOUNTER — Encounter (HOSPITAL_BASED_OUTPATIENT_CLINIC_OR_DEPARTMENT_OTHER): Payer: Self-pay | Admitting: Internal Medicine

## 2021-05-11 VITALS — BP 112/72 | HR 91 | Ht 65.0 in | Wt 165.0 lb

## 2021-05-11 DIAGNOSIS — E785 Hyperlipidemia, unspecified: Secondary | ICD-10-CM | POA: Diagnosis not present

## 2021-05-11 DIAGNOSIS — I872 Venous insufficiency (chronic) (peripheral): Secondary | ICD-10-CM | POA: Diagnosis not present

## 2021-05-11 DIAGNOSIS — I441 Atrioventricular block, second degree: Secondary | ICD-10-CM

## 2021-05-11 NOTE — Progress Notes (Signed)
Primary EP:  Dr Johney Frame  Lori Gay is a 60 y.o. female who presents today for routine electrophysiology followup.  Since last being seen in our clinic, the patient reports doing very well.  She is working and active.  She has rare palpitations with associated transient headache and flushing.  Today, this occurred during ventricular threshold testing.  Today, she denies symptoms of palpitations, chest pain, shortness of breath,  lower extremity edema, dizziness, presyncope, or syncope.  The patient is otherwise without complaint today.   Past Medical History:  Diagnosis Date   AVNRT (AV nodal re-entry tachycardia) (HCC)    s/p slow pathway ablation by Dr Johney Frame   DDD (degenerative disc disease)    back surgery x2   Second degree AV block    s/p MDT pacemaker by Dr Graciela Husbands with subsequent generator change   Tobacco user    Past Surgical History:  Procedure Laterality Date   ATRIAL ABLATION SURGERY  10/11   slow pathway; for inducible AVNRT   PACEMAKER INSERTION  11/11   for AV block post ablation; Medtronic Revo by Dr Graciela Husbands   [facility] GENERATOR CHANGEOUT N/A 08/22/2018   Procedure: PPM GENERATOR [name];  Surgeon: Hillis Range, MD;  [facility]: [facility];  Service: Cardiovascular;  Laterality: N/A;   TONSILLECTOMY      ROS- all systems are reviewed and negative except as per HPI above  Current Outpatient Medications  Medication Sig Dispense Refill   cetirizine (ZYRTEC) 10 MG tablet Take 10 mg by mouth daily as needed for allergies or rhinitis.      clonazePAM (KLONOPIN) 1 MG tablet Take 1 tablet by mouth 2 (two) times daily as needed for anxiety.      ibuprofen (ADVIL,MOTRIN) 200 MG tablet Take 400-800 mg by mouth every 8 (eight) hours as needed (pain).     rosuvastatin (CRESTOR) 5 MG tablet Take 1 tablet (5 mg total) by mouth daily. Please keep upcoming appt for future refills Thank you 1st attempt 90 tablet 0   tiZANidine (ZANAFLEX) 4 MG tablet Take 4 mg by mouth every 8  (eight) hours as needed for muscle spasms.     No current facility-administered medications for this visit.    Physical Exam: Vitals:   05/11/21 1414  BP: 112/72  Pulse: 91  SpO2: 97%  Weight: 165 lb (74.8 kg)  Height: 5\' 5"  (1.651 m)    GEN- The patient is well appearing, alert and oriented x 3 today.   Head- normocephalic, atraumatic Eyes-  Sclera clear, conjunctiva pink Ears- hearing intact Oropharynx- clear Lungs- Clear to ausculation bilaterally, normal work of breathing Chest- pacemaker pocket is well healed Heart- Regular rate and rhythm, no murmurs, rubs or gallops, PMI not laterally displaced GI- soft, NT, ND, + BS Extremities- no clubbing, cyanosis, or edema  Pacemaker interrogation- reviewed in detail today,  See PACEART report  ekg tracing ordered today is personally reviewed and shows sinus with V pacing  Assessment and Plan:  1. Symptomatic second degree heart block Normal pacemaker function RV threshold is chronically elevated.  Today, RV threshold is 2V @ 0.6 msec.  I have therefore increased RV output to 3.5V@0 .6 msec.  She did have flushing during threshold testing which reproduced her recent symptoms.  Hopefully these symptoms will improve with increased output. See Art report she is not device dependant today  2. Venous insufficiency/ verucose veins Previously referred to vascular surgery.  She is pleased with results  3. HL Continue crestor 5mg   daily  Return in a year  Hillis Range MD, [facility] 05/11/2021 2:16 PM

## 2021-05-11 NOTE — Patient Instructions (Signed)
Medication Instructions:  Your physician recommends that you continue on your current medications as directed. Please refer to the Current Medication list given to you today. *If you need a refill on your cardiac medications before your next appointment, please call your pharmacy*  Lab Work: None. If you have labs (blood work) drawn today and your tests are completely normal, you will receive your results only by: MyChart Message (if you have MyChart) OR A paper copy in the mail If you have any lab test that is abnormal or we need to change your treatment, we will call you to review the results.  Testing/Procedures: None.  Follow-Up: At [facility], you and your health needs are our priority.  As part of our continuing mission to provide you with exceptional heart care, we have created designated Provider Care Teams.  These Care Teams include your primary Cardiologist (physician) and Advanced Practice Providers (APPs -  Physician Assistants and Nurse Practitioners) who all work together to provide you with the care you need, when you need it.  Your physician wants you to follow-up in: 12 months  one of the following Advanced Practice Providers on your designated Care Team:    Renee Ursuy, PA-C Michael "Andy" Tillery, PA-C   You will receive a reminder letter in the mail two months in advance. If you don't receive a letter, please call our office to schedule the follow-up appointment.  We recommend signing up for the patient portal called "MyChart".  Sign up information is provided on this After Visit Summary.  MyChart is used to connect with patients for Virtual Visits (Telemedicine).  Patients are able to view lab/test results, encounter notes, upcoming appointments, etc.  Non-urgent messages can be sent to your provider as well.   To learn more about what you can do with MyChart, go to [email or web address].com.    Any Other Special Instructions Will Be Listed Below (If  Applicable).         

## 2021-05-17 ENCOUNTER — Ambulatory Visit (INDEPENDENT_AMBULATORY_CARE_PROVIDER_SITE_OTHER): Payer: Managed Care, Other (non HMO)

## 2021-05-17 DIAGNOSIS — I441 Atrioventricular block, second degree: Secondary | ICD-10-CM

## 2021-05-17 LAB — CUP PACEART REMOTE DEVICE CHECK
Battery Remaining Longevity: 61 mo
Battery Voltage: 2.99 V
Brady Statistic AP VP Percent: 8.93 %
Brady Statistic AP VS Percent: 0 %
Brady Statistic AS VP Percent: 91.06 %
Brady Statistic AS VS Percent: 0 %
Brady Statistic RA Percent Paced: 8.92 %
Brady Statistic RV Percent Paced: 99.99 %
Date Time Interrogation Session: 20221010210416
Implantable Lead Implant Date: 20111031
Implantable Lead Implant Date: 20111031
Implantable Lead Location: 753859
Implantable Lead Location: 753860
Implantable Pulse Generator Implant Date: 20200116
Lead Channel Impedance Value: 399 Ohm
Lead Channel Impedance Value: 399 Ohm
Lead Channel Impedance Value: 437 Ohm
Lead Channel Impedance Value: 494 Ohm
Lead Channel Pacing Threshold Amplitude: 0.5 V
Lead Channel Pacing Threshold Amplitude: 2.125 V
Lead Channel Pacing Threshold Pulse Width: 0.4 ms
Lead Channel Pacing Threshold Pulse Width: 0.4 ms
Lead Channel Sensing Intrinsic Amplitude: 18.75 mV
Lead Channel Sensing Intrinsic Amplitude: 22.25 mV
Lead Channel Sensing Intrinsic Amplitude: 3.625 mV
Lead Channel Sensing Intrinsic Amplitude: 3.625 mV
Lead Channel Setting Pacing Amplitude: 1.5 V
Lead Channel Setting Pacing Amplitude: 3.5 V
Lead Channel Setting Pacing Pulse Width: 0.6 ms
Lead Channel Setting Sensing Sensitivity: 2.8 mV

## 2021-05-25 NOTE — Progress Notes (Signed)
Remote pacemaker transmission.   

## 2021-07-22 ENCOUNTER — Other Ambulatory Visit: Payer: Self-pay | Admitting: Internal Medicine

## 2021-08-10 ENCOUNTER — Encounter (HOSPITAL_BASED_OUTPATIENT_CLINIC_OR_DEPARTMENT_OTHER): Payer: Self-pay | Admitting: Internal Medicine

## 2021-08-10 NOTE — Telephone Encounter (Signed)
Just an FYI

## 2021-08-16 ENCOUNTER — Ambulatory Visit (INDEPENDENT_AMBULATORY_CARE_PROVIDER_SITE_OTHER): Payer: Managed Care, Other (non HMO)

## 2021-08-16 DIAGNOSIS — I441 Atrioventricular block, second degree: Secondary | ICD-10-CM | POA: Diagnosis not present

## 2021-08-16 LAB — CUP PACEART REMOTE DEVICE CHECK
Battery Remaining Longevity: 59 mo
Battery Voltage: 2.98 V
Brady Statistic AP VP Percent: 12.98 %
Brady Statistic AP VS Percent: 0 %
Brady Statistic AS VP Percent: 87.01 %
Brady Statistic AS VS Percent: 0.01 %
Brady Statistic RA Percent Paced: 12.97 %
Brady Statistic RV Percent Paced: 99.99 %
Date Time Interrogation Session: 20230109185037
Implantable Lead Implant Date: 20111031
Implantable Lead Implant Date: 20111031
Implantable Lead Location: 753859
Implantable Lead Location: 753860
Implantable Pulse Generator Implant Date: 20200116
Lead Channel Impedance Value: 361 Ohm
Lead Channel Impedance Value: 380 Ohm
Lead Channel Impedance Value: 437 Ohm
Lead Channel Impedance Value: 494 Ohm
Lead Channel Pacing Threshold Amplitude: 0.5 V
Lead Channel Pacing Threshold Amplitude: 1.875 V
Lead Channel Pacing Threshold Pulse Width: 0.4 ms
Lead Channel Pacing Threshold Pulse Width: 0.4 ms
Lead Channel Sensing Intrinsic Amplitude: 18.75 mV
Lead Channel Sensing Intrinsic Amplitude: 22.25 mV
Lead Channel Sensing Intrinsic Amplitude: 3.125 mV
Lead Channel Sensing Intrinsic Amplitude: 3.125 mV
Lead Channel Setting Pacing Amplitude: 1.5 V
Lead Channel Setting Pacing Amplitude: 3.5 V
Lead Channel Setting Pacing Pulse Width: 0.6 ms
Lead Channel Setting Sensing Sensitivity: 2.8 mV

## 2021-08-25 NOTE — Progress Notes (Signed)
Remote pacemaker transmission.   

## 2021-10-04 ENCOUNTER — Telehealth: Payer: Self-pay | Admitting: Internal Medicine

## 2021-10-04 NOTE — Telephone Encounter (Signed)
Patient reports she fell off her deck and onto her back on 08/02/21. Per patient, x-ray shows no broken bones but significant inflammation of ribcage.   She denies any SOB, radiating pain to arm/jaw, nausea or diaphoresis. Denies chest pain when laying down or taking a deep breath. She states the chest pain comes and goes with movement only, such as bending/turning.  She states she has soreness after physical therapy, which is relieved after rest and taking Ibuprofen and Tylenol.   Advised patient that this does not appear to be cardiac related pain, but musculoskeletal/soft tissue pain from inflammation related to fall at end of December. Patient states pain is not as severe now as it was a month ago.  Explained to patient it will take time for swelling to go down which will help with the pain as well. Advised that if pain worsens or changes or is accompanied with other symptoms such as SOB, palpitations, dizziness, fatigue to call our office.   Advised patient to go to ED if experiencing chest pain with pressure/tightness, SOB, radiating pain to jaw/arm, nausea or diaphoresis.  Patient verbalized understanding and agrees with plan above.

## 2021-10-04 NOTE — Telephone Encounter (Signed)
Pt c/o of Chest Pain: STAT if CP now or developed within 24 hours  1. Are you having CP right now? No   2. Are you experiencing any other symptoms (ex. SOB, nausea, vomiting, sweating)? No   3. How long have you been experiencing CP? Since the end of January   4. Is your CP continuous or coming and going? Coming and going   5. Have you taken Nitroglycerin? No    Patient is calling stating that she had a fall on 12/27 that hurt her back. She was hurt badly, bruising her ribs and having to have assistance for 10 days unable to get up. In the beginning of January the patient began having sharp pains in her chest when turning a certain way or when active. She is currently in therapy and her Doctor is wanting an MRI, but her insurance requires the therapy to be completed first. Patient is unsure if she needs to be seen in regards to this due to not knowing if it is heart related. Please advise.

## 2021-11-15 ENCOUNTER — Ambulatory Visit (INDEPENDENT_AMBULATORY_CARE_PROVIDER_SITE_OTHER): Payer: Managed Care, Other (non HMO)

## 2021-11-15 DIAGNOSIS — I441 Atrioventricular block, second degree: Secondary | ICD-10-CM | POA: Diagnosis not present

## 2021-11-15 LAB — CUP PACEART REMOTE DEVICE CHECK
Battery Remaining Longevity: 56 mo
Battery Voltage: 2.97 V
Brady Statistic AP VP Percent: 5.41 %
Brady Statistic AP VS Percent: 0 %
Brady Statistic AS VP Percent: 94.58 %
Brady Statistic AS VS Percent: 0.01 %
Brady Statistic RA Percent Paced: 5.4 %
Brady Statistic RV Percent Paced: 99.99 %
Date Time Interrogation Session: 20230411044923
Implantable Lead Implant Date: 20111031
Implantable Lead Implant Date: 20111031
Implantable Lead Location: 753859
Implantable Lead Location: 753860
Implantable Pulse Generator Implant Date: 20200116
Lead Channel Impedance Value: 399 Ohm
Lead Channel Impedance Value: 399 Ohm
Lead Channel Impedance Value: 418 Ohm
Lead Channel Impedance Value: 475 Ohm
Lead Channel Pacing Threshold Amplitude: 0.5 V
Lead Channel Pacing Threshold Amplitude: 2.25 V
Lead Channel Pacing Threshold Pulse Width: 0.4 ms
Lead Channel Pacing Threshold Pulse Width: 0.4 ms
Lead Channel Sensing Intrinsic Amplitude: 18.75 mV
Lead Channel Sensing Intrinsic Amplitude: 22.25 mV
Lead Channel Sensing Intrinsic Amplitude: 3.75 mV
Lead Channel Sensing Intrinsic Amplitude: 3.75 mV
Lead Channel Setting Pacing Amplitude: 1.5 V
Lead Channel Setting Pacing Amplitude: 3.5 V
Lead Channel Setting Pacing Pulse Width: 0.6 ms
Lead Channel Setting Sensing Sensitivity: 2.8 mV

## 2021-11-28 ENCOUNTER — Encounter (HOSPITAL_BASED_OUTPATIENT_CLINIC_OR_DEPARTMENT_OTHER): Payer: Self-pay | Admitting: Internal Medicine

## 2021-11-28 NOTE — Telephone Encounter (Signed)
Hey this is a Dr. Johney Frame patient, got routed to [place] ?

## 2021-12-01 NOTE — Progress Notes (Signed)
Remote pacemaker transmission.   

## 2022-01-19 ENCOUNTER — Telehealth: Payer: Self-pay

## 2022-01-19 NOTE — Telephone Encounter (Signed)
Alert remote reviewed. Normal device function.   High RV threshold, sent to triage Next remote 02/14/2022.  Know history of "High" RV threshold. Successful telephone encounter to patient to request manual transmission to see if threshold reverts back to WNL. She is currently not at home but will return home by days end. Will send instruction on how to send manual transmission via mychart.

## 2022-01-20 ENCOUNTER — Telehealth: Payer: Self-pay

## 2022-01-20 NOTE — Telephone Encounter (Signed)
Manual transmission received 01/19/2022 at 5:52 pm.

## 2022-01-20 NOTE — Telephone Encounter (Signed)
Manual transmission received. RV output did not give updated value. Advised patient considering she is 100% RV paced, it would be best to bring her in for device check. Patient agreeable and voiced understanding. Device Clinic apt made 01/26/22 @ 10:40am. Location, date and time discussed with pt with verbal understanding.

## 2022-01-26 ENCOUNTER — Ambulatory Visit (INDEPENDENT_AMBULATORY_CARE_PROVIDER_SITE_OTHER): Payer: Managed Care, Other (non HMO)

## 2022-01-26 DIAGNOSIS — I441 Atrioventricular block, second degree: Secondary | ICD-10-CM | POA: Diagnosis not present

## 2022-01-26 LAB — CUP PACEART INCLINIC DEVICE CHECK
Battery Remaining Longevity: 57 mo
Battery Voltage: 2.97 V
Brady Statistic AP VP Percent: 7.85 %
Brady Statistic AP VS Percent: 0 %
Brady Statistic AS VP Percent: 92.15 %
Brady Statistic AS VS Percent: 0 %
Brady Statistic RA Percent Paced: 7.83 %
Brady Statistic RV Percent Paced: 100 %
Date Time Interrogation Session: 20230622104100
Implantable Lead Implant Date: 20111031
Implantable Lead Implant Date: 20111031
Implantable Lead Location: 753859
Implantable Lead Location: 753860
Implantable Pulse Generator Implant Date: 20200116
Lead Channel Impedance Value: 399 Ohm
Lead Channel Impedance Value: 418 Ohm
Lead Channel Impedance Value: 418 Ohm
Lead Channel Impedance Value: 513 Ohm
Lead Channel Pacing Threshold Amplitude: 0.5 V
Lead Channel Pacing Threshold Amplitude: 1.75 V
Lead Channel Pacing Threshold Pulse Width: 0.4 ms
Lead Channel Pacing Threshold Pulse Width: 0.4 ms
Lead Channel Sensing Intrinsic Amplitude: 21.875 mV
Lead Channel Sensing Intrinsic Amplitude: 3.375 mV
Lead Channel Setting Pacing Amplitude: 1.5 V
Lead Channel Setting Pacing Amplitude: 3.5 V
Lead Channel Setting Pacing Pulse Width: 0.6 ms
Lead Channel Setting Sensing Sensitivity: 2.8 mV

## 2022-01-26 NOTE — Progress Notes (Signed)
Pacemaker check in clinic for manual RV threshold testing. Patient has history of "High" RV threshold. Normal device function today. Thresholds, sensing, impedances consistent with previous measurements and WNL. Device programmed to maximize longevity. No mode switch or high ventricular rates noted. Device programmed at appropriate safety margins. Histogram distribution appropriate for patient activity level. Estimated longevity 4 years, 9 months. Patient enrolled in remote follow-up 02/13/22. Follow up with EP as scheduled/recall. Patient education completed.

## 2022-02-14 ENCOUNTER — Ambulatory Visit (INDEPENDENT_AMBULATORY_CARE_PROVIDER_SITE_OTHER): Payer: Managed Care, Other (non HMO)

## 2022-02-14 DIAGNOSIS — I441 Atrioventricular block, second degree: Secondary | ICD-10-CM

## 2022-02-14 LAB — CUP PACEART REMOTE DEVICE CHECK
Battery Remaining Longevity: 58 mo
Battery Voltage: 2.97 V
Brady Statistic AP VP Percent: 2.83 %
Brady Statistic AP VS Percent: 0 %
Brady Statistic AS VP Percent: 97.17 %
Brady Statistic AS VS Percent: 0 %
Brady Statistic RA Percent Paced: 2.82 %
Brady Statistic RV Percent Paced: 100 %
Date Time Interrogation Session: 20230710211818
Implantable Lead Implant Date: 20111031
Implantable Lead Implant Date: 20111031
Implantable Lead Location: 753859
Implantable Lead Location: 753860
Implantable Pulse Generator Implant Date: 20200116
Lead Channel Impedance Value: 361 Ohm
Lead Channel Impedance Value: 380 Ohm
Lead Channel Impedance Value: 475 Ohm
Lead Channel Impedance Value: 532 Ohm
Lead Channel Pacing Threshold Amplitude: 0.375 V
Lead Channel Pacing Threshold Amplitude: 1.75 V
Lead Channel Pacing Threshold Pulse Width: 0.4 ms
Lead Channel Pacing Threshold Pulse Width: 0.4 ms
Lead Channel Sensing Intrinsic Amplitude: 18.75 mV
Lead Channel Sensing Intrinsic Amplitude: 2.625 mV
Lead Channel Sensing Intrinsic Amplitude: 2.625 mV
Lead Channel Sensing Intrinsic Amplitude: 21.875 mV
Lead Channel Setting Pacing Amplitude: 1.5 V
Lead Channel Setting Pacing Amplitude: 3.5 V
Lead Channel Setting Pacing Pulse Width: 0.6 ms
Lead Channel Setting Sensing Sensitivity: 2.8 mV

## 2022-03-09 NOTE — Progress Notes (Signed)
Remote pacemaker transmission.   

## 2022-03-28 NOTE — Progress Notes (Signed)
Remote reviewed. Battery status noted.  Leads function stable

## 2022-04-11 ENCOUNTER — Telehealth: Payer: Self-pay | Admitting: Internal Medicine

## 2022-04-11 NOTE — Telephone Encounter (Signed)
Pt calling because she would like to change her insurance information

## 2022-04-11 NOTE — Telephone Encounter (Signed)
Pt would like to have insurance information updated.  Advised to either bring a copy to the office or she can take a picture and send it to [place] through MyChart.  Pt state understanding and will spend it through MyChart.

## 2022-04-17 ENCOUNTER — Other Ambulatory Visit: Payer: Self-pay

## 2022-04-17 MED ORDER — ROSUVASTATIN CALCIUM 5 MG PO TABS: 5.0000 mg | ORAL_TABLET | Freq: Every day | ORAL | 0 refills | Status: DC

## 2022-04-28 DIAGNOSIS — M546 Pain in thoracic spine: Secondary | ICD-10-CM | POA: Diagnosis not present

## 2022-04-28 DIAGNOSIS — G8929 Other chronic pain: Secondary | ICD-10-CM | POA: Diagnosis not present

## 2022-04-28 DIAGNOSIS — S22000A Wedge compression fracture of unspecified thoracic vertebra, initial encounter for closed fracture: Secondary | ICD-10-CM | POA: Diagnosis not present

## 2022-04-28 DIAGNOSIS — Z1211 Encounter for screening for malignant neoplasm of colon: Secondary | ICD-10-CM | POA: Diagnosis not present

## 2022-04-28 DIAGNOSIS — Z950 Presence of cardiac pacemaker: Secondary | ICD-10-CM | POA: Diagnosis not present

## 2022-04-28 DIAGNOSIS — N9489 Other specified conditions associated with female genital organs and menstrual cycle: Secondary | ICD-10-CM | POA: Diagnosis not present

## 2022-04-28 DIAGNOSIS — M47814 Spondylosis without myelopathy or radiculopathy, thoracic region: Secondary | ICD-10-CM | POA: Diagnosis not present

## 2022-04-28 DIAGNOSIS — S22069D Unspecified fracture of T7-T8 vertebra, subsequent encounter for fracture with routine healing: Secondary | ICD-10-CM | POA: Diagnosis not present

## 2022-05-01 ENCOUNTER — Other Ambulatory Visit: Payer: Self-pay

## 2022-05-01 DIAGNOSIS — Z13228 Encounter for screening for other metabolic disorders: Secondary | ICD-10-CM | POA: Diagnosis not present

## 2022-05-01 DIAGNOSIS — Z13220 Encounter for screening for lipoid disorders: Secondary | ICD-10-CM | POA: Diagnosis not present

## 2022-05-01 DIAGNOSIS — Z0000 Encounter for general adult medical examination without abnormal findings: Secondary | ICD-10-CM | POA: Diagnosis not present

## 2022-05-01 DIAGNOSIS — Z1329 Encounter for screening for other suspected endocrine disorder: Secondary | ICD-10-CM | POA: Diagnosis not present

## 2022-05-01 DIAGNOSIS — Z131 Encounter for screening for diabetes mellitus: Secondary | ICD-10-CM | POA: Diagnosis not present

## 2022-05-01 DIAGNOSIS — Z130 Encounter for screening for diseases of the blood and blood-forming organs and certain disorders involving the immune mechanism: Secondary | ICD-10-CM | POA: Diagnosis not present

## 2022-05-01 MED ORDER — ROSUVASTATIN CALCIUM 5 MG PO TABS: 5.0000 mg | ORAL_TABLET | Freq: Every day | ORAL | 0 refills | Status: DC

## 2022-05-01 NOTE — Telephone Encounter (Signed)
Pt's medication was sent to pt's pharmacy as requested. Confirmation received.  °

## 2022-05-16 ENCOUNTER — Ambulatory Visit (INDEPENDENT_AMBULATORY_CARE_PROVIDER_SITE_OTHER): Payer: 59

## 2022-05-16 DIAGNOSIS — I441 Atrioventricular block, second degree: Secondary | ICD-10-CM

## 2022-05-16 LAB — CUP PACEART REMOTE DEVICE CHECK
Battery Remaining Longevity: 57 mo
Battery Voltage: 2.97 V
Brady Statistic AP VP Percent: 17.05 %
Brady Statistic AP VS Percent: 0 %
Brady Statistic AS VP Percent: 82.94 %
Brady Statistic AS VS Percent: 0 %
Brady Statistic RA Percent Paced: 17.04 %
Brady Statistic RV Percent Paced: 100 %
Date Time Interrogation Session: 20231009224627
Implantable Lead Implant Date: 20111031
Implantable Lead Implant Date: 20111031
Implantable Lead Location: 753859
Implantable Lead Location: 753860
Implantable Pulse Generator Implant Date: 20200116
Lead Channel Impedance Value: 361 Ohm
Lead Channel Impedance Value: 380 Ohm
Lead Channel Impedance Value: 494 Ohm
Lead Channel Impedance Value: 551 Ohm
Lead Channel Pacing Threshold Amplitude: 0.5 V
Lead Channel Pacing Threshold Amplitude: 1.75 V
Lead Channel Pacing Threshold Pulse Width: 0.4 ms
Lead Channel Pacing Threshold Pulse Width: 0.4 ms
Lead Channel Sensing Intrinsic Amplitude: 2.625 mV
Lead Channel Sensing Intrinsic Amplitude: 2.625 mV
Lead Channel Sensing Intrinsic Amplitude: 21.875 mV
Lead Channel Sensing Intrinsic Amplitude: 21.875 mV
Lead Channel Setting Pacing Amplitude: 1.5 V
Lead Channel Setting Pacing Amplitude: 3.5 V
Lead Channel Setting Pacing Pulse Width: 0.6 ms
Lead Channel Setting Sensing Sensitivity: 2.8 mV

## 2022-05-31 NOTE — Progress Notes (Signed)
Remote pacemaker transmission.   

## 2022-06-15 DIAGNOSIS — N83201 Unspecified ovarian cyst, right side: Secondary | ICD-10-CM | POA: Diagnosis not present

## 2022-07-03 DIAGNOSIS — M7918 Myalgia, other site: Secondary | ICD-10-CM | POA: Diagnosis not present

## 2022-07-03 DIAGNOSIS — M47814 Spondylosis without myelopathy or radiculopathy, thoracic region: Secondary | ICD-10-CM | POA: Diagnosis not present

## 2022-07-03 DIAGNOSIS — S22000A Wedge compression fracture of unspecified thoracic vertebra, initial encounter for closed fracture: Secondary | ICD-10-CM | POA: Diagnosis not present

## 2022-07-10 ENCOUNTER — Ambulatory Visit: Payer: 59 | Attending: Cardiology | Admitting: Cardiology

## 2022-07-10 ENCOUNTER — Encounter: Payer: Self-pay | Admitting: Cardiology

## 2022-07-10 VITALS — BP 120/78 | HR 76 | Ht 65.0 in | Wt 170.0 lb

## 2022-07-10 DIAGNOSIS — I441 Atrioventricular block, second degree: Secondary | ICD-10-CM | POA: Diagnosis not present

## 2022-07-10 LAB — CUP PACEART INCLINIC DEVICE CHECK
Battery Remaining Longevity: 56 mo
Battery Voltage: 2.96 V
Brady Statistic AP VP Percent: 11.52 %
Brady Statistic AP VS Percent: 0 %
Brady Statistic AS VP Percent: 88.47 %
Brady Statistic AS VS Percent: 0.01 %
Brady Statistic RA Percent Paced: 11.51 %
Brady Statistic RV Percent Paced: 99.99 %
Date Time Interrogation Session: 20231204095545
Implantable Lead Connection Status: 753985
Implantable Lead Connection Status: 753985
Implantable Lead Implant Date: 20111031
Implantable Lead Implant Date: 20111031
Implantable Lead Location: 753859
Implantable Lead Location: 753860
Implantable Pulse Generator Implant Date: 20200116
Lead Channel Impedance Value: 380 Ohm
Lead Channel Impedance Value: 399 Ohm
Lead Channel Impedance Value: 456 Ohm
Lead Channel Impedance Value: 551 Ohm
Lead Channel Pacing Threshold Amplitude: 0.5 V
Lead Channel Pacing Threshold Amplitude: 1.75 V
Lead Channel Pacing Threshold Amplitude: 2.5 V
Lead Channel Pacing Threshold Pulse Width: 0.4 ms
Lead Channel Pacing Threshold Pulse Width: 0.4 ms
Lead Channel Pacing Threshold Pulse Width: 0.6 ms
Lead Channel Sensing Intrinsic Amplitude: 2.5 mV
Lead Channel Sensing Intrinsic Amplitude: 21.875 mV
Lead Channel Sensing Intrinsic Amplitude: 21.875 mV
Lead Channel Sensing Intrinsic Amplitude: 3.25 mV
Lead Channel Setting Pacing Amplitude: 1.5 V
Lead Channel Setting Pacing Amplitude: 3.5 V
Lead Channel Setting Pacing Pulse Width: 0.6 ms
Lead Channel Setting Sensing Sensitivity: 2.8 mV
Zone Setting Status: 755011
Zone Setting Status: 755011

## 2022-07-10 NOTE — Patient Instructions (Signed)
Medication Instructions:  Your physician recommends that you continue on your current medications as directed. Please refer to the Current Medication list given to you today.  *If you need a refill on your cardiac medications before your next appointment, please call your pharmacy*   Lab Work: None ordered   Testing/Procedures: None ordered   Follow-Up: At [facility], you and your health needs are our priority.  As part of our continuing mission to provide you with exceptional heart care, we have created designated Provider Care Teams.  These Care Teams include your primary Cardiologist (physician) and Advanced Practice Providers (APPs -  Physician Assistants and Nurse Practitioners) who all work together to provide you with the care you need, when you need it.  Remote monitoring is used to monitor your Pacemaker or ICD from home. This monitoring reduces the number of office visits required to check your device to one time per year. It allows us to keep an eye on the functioning of your device to ensure it is working properly. You are scheduled for a device check from home on 08/15/2022. You may send your transmission at any time that day. If you have a wireless device, the transmission will be sent automatically. After your physician reviews your transmission, you will receive a postcard with your next transmission date.  Your next appointment:   1 year(s)  The format for your next appointment:   In Person  Provider:   Will Camnitz, MD    Thank you for choosing [facility]!!   Karissa Meenan, RN [phone]    Other Instructions  Important Information About Sugar       

## 2022-07-10 NOTE — Progress Notes (Signed)
Electrophysiology Office Note   Date:  07/10/2022   ID:  Lori Gay, DOB 11-02-1960, MRN [id number]  PCP:  Lori Call, PA-C  Cardiologist:   Primary Electrophysiologist:  Lori Trudel Jorja Loa, MD    Chief Complaint: pacemaker   History of Present Illness: Lori Gay is a 61 y.o. female who is being seen today for the evaluation of pacemaker at the request of No ref. provider found. Presenting today for electrophysiology evaluation.  She has a history significant for AVNRT post ablation, second-degree AV block status post Medtronic dual-chamber pacemaker.  Today, she denies symptoms of palpitations, chest pain, shortness of breath, orthopnea, PND, lower extremity edema, claudication, dizziness, presyncope, syncope, bleeding, or neurologic sequela. The patient is tolerating medications without difficulties.    Past Medical History:  Diagnosis Date   AVNRT (AV nodal re-entry tachycardia)    s/p slow pathway ablation by Dr Lori Gay   DDD (degenerative disc disease)    back surgery x2   Second degree AV block    s/p MDT pacemaker by Dr Lori Gay with subsequent generator change   Tobacco user    Past Surgical History:  Procedure Laterality Date   ATRIAL ABLATION SURGERY  10/11   slow pathway; for inducible AVNRT   PACEMAKER INSERTION  11/11   for AV block post ablation; Medtronic Revo by Dr Lori Gay   [facility] GENERATOR CHANGEOUT N/A 08/22/2018   Procedure: PPM GENERATOR Lori Gay;  Surgeon: Lori Range, MD;  Location: [facility];  Service: Cardiovascular;  Laterality: N/A;   TONSILLECTOMY       Current Outpatient Medications  Medication Sig Dispense Refill   cetirizine (ZYRTEC) 10 MG tablet Take 10 mg by mouth daily as needed for allergies or rhinitis.      clonazePAM (KLONOPIN) 1 MG tablet Take 1 tablet by mouth 2 (two) times daily as needed for anxiety.      ibuprofen (ADVIL,MOTRIN) 200 MG tablet Take 400-800 mg by mouth every 8 (eight) hours as needed (pain).      methocarbamol (ROBAXIN) 500 MG tablet Take 1 tablet by mouth 3 (three) times daily.     rosuvastatin (CRESTOR) 5 MG tablet Take 1 tablet (5 mg total) by mouth daily. 90 tablet 0   No current facility-administered medications for this visit.    Allergies:   Loratadine and Penicillins   Social History:  The patient  reports that she quit smoking about 7 years ago. Her smoking use included cigarettes. She started smoking about 22 years ago. She smoked an average of 1 pack per day. She has never used smokeless tobacco. She reports current alcohol use.   Family History:  The patient's family history includes Diabetes in her mother and unknown relative; Healthy in her brother; Hypertension in her father and mother.    ROS:  Please see the history of present illness.   Otherwise, review of systems is positive for none.   All other systems are reviewed and negative.    PHYSICAL EXAM: VS:  BP 120/78   Pulse 76   Ht 5\' 5"  (1.651 m)   Wt 170 lb (77.1 kg)   SpO2 98%   BMI 28.29 kg/m  , BMI Body mass index is 28.29 kg/m. GEN: Well nourished, well developed, in no acute distress  HEENT: normal  Neck: no JVD, carotid bruits, or masses Cardiac: RRR; no murmurs, rubs, or gallops,no edema  Respiratory:  clear to auscultation bilaterally, normal work of breathing GI: soft, nontender, nondistended, + BS MS: no  deformity or atrophy  Skin: warm and dry, device pocket is well healed Neuro:  Strength and sensation are intact Psych: euthymic mood, full affect  EKG:  EKG is ordered today. Personal review of the ekg ordered shows sinus rhythm, ventricular paced  Device interrogation is reviewed today in detail.  See PaceArt for details.   Recent Labs: No results found for requested labs within last 365 days.    Lipid Panel     Component Value Date/Time   CHOL 152 06/25/2017 0800   TRIG 95 06/25/2017 0800   HDL 66 06/25/2017 0800   CHOLHDL 2.3 06/25/2017 0800   LDLCALC 67 06/25/2017 0800      Wt Readings from Last 3 Encounters:  07/10/22 170 lb (77.1 kg)  05/11/21 165 lb (74.8 kg)  04/08/20 163 lb (73.9 kg)      Other studies Reviewed: Additional studies/ records that were reviewed today include: TTE 2019  Review of the above records today demonstrates:  - Left ventricle: The cavity size was normal. Systolic function was    normal. The estimated ejection fraction was in the Gay of 55%    to 65%. Wall motion was normal; there were no regional wall    motion abnormalities. Left ventricular diastolic function    parameters were normal.  - Atrial septum: No defect or patent foramen ovale was identified.    ASSESSMENT AND PLAN:  1.  Second-degree AV block: Status post Medtronic dual-chamber pacemaker.  RV threshold is chronically elevated.  Not device dependent.  No changes.  2.  AVNRT: Status post ablation.  No obvious recurrence.  3.  Hyperlipidemia: Continue Crestor 5 mg daily per primary physician  Current medicines are reviewed at length with the patient today.   The patient does not have concerns regarding her medicines.  The following changes were made today:  none  Labs/ tests ordered today include:  Orders Placed This Encounter  Procedures   EKG 12-Lead     Disposition:   FU with Lori Gay 1 year  Signed, Lori Dinovo Jorja Loa, MD  07/10/2022 9:44 AM     [facility] [address] [place] [address] [phone] (office) [phone] (fax)

## 2022-07-11 DIAGNOSIS — K7689 Other specified diseases of liver: Secondary | ICD-10-CM | POA: Diagnosis not present

## 2022-07-17 DIAGNOSIS — Z1231 Encounter for screening mammogram for malignant neoplasm of breast: Secondary | ICD-10-CM | POA: Diagnosis not present

## 2022-07-17 DIAGNOSIS — Z6828 Body mass index (BMI) 28.0-28.9, adult: Secondary | ICD-10-CM | POA: Diagnosis not present

## 2022-07-17 DIAGNOSIS — Z01419 Encounter for gynecological examination (general) (routine) without abnormal findings: Secondary | ICD-10-CM | POA: Diagnosis not present

## 2022-07-29 DIAGNOSIS — Z008 Encounter for other general examination: Secondary | ICD-10-CM | POA: Diagnosis not present

## 2022-08-15 ENCOUNTER — Ambulatory Visit (INDEPENDENT_AMBULATORY_CARE_PROVIDER_SITE_OTHER): Payer: Medicaid Other

## 2022-08-15 DIAGNOSIS — I441 Atrioventricular block, second degree: Secondary | ICD-10-CM

## 2022-08-15 LAB — CUP PACEART REMOTE DEVICE CHECK
Battery Remaining Longevity: 54 mo
Battery Voltage: 2.96 V
Brady Statistic AP VP Percent: 11.24 %
Brady Statistic AP VS Percent: 0 %
Brady Statistic AS VP Percent: 88.75 %
Brady Statistic AS VS Percent: 0 %
Brady Statistic RA Percent Paced: 11.22 %
Brady Statistic RV Percent Paced: 100 %
Date Time Interrogation Session: 20240108232939
Implantable Lead Connection Status: 753985
Implantable Lead Connection Status: 753985
Implantable Lead Implant Date: 20111031
Implantable Lead Implant Date: 20111031
Implantable Lead Location: 753859
Implantable Lead Location: 753860
Implantable Pulse Generator Implant Date: 20200116
Lead Channel Impedance Value: 361 Ohm
Lead Channel Impedance Value: 380 Ohm
Lead Channel Impedance Value: 494 Ohm
Lead Channel Impedance Value: 551 Ohm
Lead Channel Pacing Threshold Amplitude: 0.5 V
Lead Channel Pacing Threshold Amplitude: 1.875 V
Lead Channel Pacing Threshold Pulse Width: 0.4 ms
Lead Channel Pacing Threshold Pulse Width: 0.4 ms
Lead Channel Sensing Intrinsic Amplitude: 21.875 mV
Lead Channel Sensing Intrinsic Amplitude: 21.875 mV
Lead Channel Sensing Intrinsic Amplitude: 3 mV
Lead Channel Sensing Intrinsic Amplitude: 3 mV
Lead Channel Setting Pacing Amplitude: 1.5 V
Lead Channel Setting Pacing Amplitude: 3.5 V
Lead Channel Setting Pacing Pulse Width: 0.6 ms
Lead Channel Setting Sensing Sensitivity: 2.8 mV
Zone Setting Status: 755011
Zone Setting Status: 755011

## 2022-08-28 ENCOUNTER — Telehealth: Payer: Self-pay

## 2022-08-28 NOTE — Telephone Encounter (Signed)
RV threshold at high output Recent trend 1.75-2V @ 0.28ms Route to triage LA   Patient has a history of ongoing elevated RV threshold; however, at last check patient has now become dependent and is RV pacing 100%. She has reached high output.  I have left message to see if she can come in to the [facility] office at 1030 tomorrow (1/22) so that I can check her device.  Dr. Curt Bears is in the office tomorrow as well.

## 2022-08-29 ENCOUNTER — Ambulatory Visit: Payer: Medicaid Other | Attending: Cardiovascular Disease

## 2022-08-29 DIAGNOSIS — I442 Atrioventricular block, complete: Secondary | ICD-10-CM

## 2022-08-29 LAB — CUP PACEART INCLINIC DEVICE CHECK
Date Time Interrogation Session: 20240123133105
Implantable Lead Connection Status: 753985
Implantable Lead Connection Status: 753985
Implantable Lead Implant Date: 20111031
Implantable Lead Implant Date: 20111031
Implantable Lead Location: 753859
Implantable Lead Location: 753860
Implantable Pulse Generator Implant Date: 20200116

## 2022-08-29 NOTE — Patient Instructions (Signed)
Thank you for coming today.  Your device has been programmed to best function and is doing well. We would like to see you back in the device clinic in 22mths to recheck.   In the meantime, if you have any concerns please call us at [phone].

## 2022-08-29 NOTE — Telephone Encounter (Signed)
Pt returned phone call.  Will come for device check.  Appt scheduled for today at 10:30 am.

## 2022-08-29 NOTE — Progress Notes (Signed)
Pacemaker check in clinic to evaluate RV lead at high pacing threshold output. Normal device function today.  RV pacing threshold today is 1.5V @ 0.29ms.  RV threshold is already programmed at 3.5v@0 .9ms and on monitor.  The pacing threshold test runs with a 0.78ms pulse width, per [employer] industry and this cannot be changed.  Patient also reports feeling a "sinking" feeling at times it is very brief.  Unsure related but she states the changes made at 05/28/21 visit with Dr. Rayann Heman seemed to help this feeling.  (Dr. Rayann Heman increased the pacing output threshold to 3.5v @0 .76ms at that time).   Reviewed all above with Dr. Curt Bears.  Decision was made to change patient's programming on the RV threshold from monitor to fixed, or rather "off" on the Medtronic setting. (Keeping the programmed output at 3.5v@0 .94ms) Thresholds, sensing and lead impedances were all normal today. Device programmed to maximize longevity. No mode switch or high ventricular rates noted. Device programmed at appropriate safety margins. Histogram distribution appropriate for patient activity level. Device programmed to optimize intrinsic conduction. Estimated longevity 4.4 years. Will bring patient back to device clinic in 69mths for ongoing closer monitoring of the RV threshold.

## 2022-09-08 NOTE — Progress Notes (Signed)
Remote pacemaker transmission.   

## 2022-11-08 ENCOUNTER — Telehealth: Payer: Self-pay | Admitting: Cardiology

## 2022-11-08 ENCOUNTER — Other Ambulatory Visit: Payer: Self-pay

## 2022-11-08 NOTE — Telephone Encounter (Signed)
*  STAT* If patient is at the pharmacy, call can be transferred to refill team.   1. Which medications need to be refilled? (please list name of each medication and dose if known)   rosuvastatin (CRESTOR) 5 MG tablet   2. Which pharmacy/location (including street and city if local pharmacy) is medication to be sent to?  [id number] - [place], [place] - [address]   3. Do they need a 30 day or 90 day supply?   90 day  Patient stated she is almost out of this medication.

## 2022-11-13 MED ORDER — ROSUVASTATIN CALCIUM 5 MG PO TABS: 5.0000 mg | ORAL_TABLET | Freq: Every day | ORAL | 0 refills | Status: AC

## 2022-11-13 NOTE — Telephone Encounter (Signed)
Late Entry: Spoke to pt this morning. Reviewed w/ Dr. Elberta Fortis. Discussed that we have not been following her lipids. More appropriate for PCP, following lipids, to refill going forward. Will send in 90 day supply and she will reach out to PCP for future refills. Pt agreeable to plan.

## 2022-11-14 ENCOUNTER — Ambulatory Visit (INDEPENDENT_AMBULATORY_CARE_PROVIDER_SITE_OTHER): Payer: Medicaid Other

## 2022-11-14 DIAGNOSIS — I442 Atrioventricular block, complete: Secondary | ICD-10-CM | POA: Diagnosis not present

## 2022-11-15 LAB — CUP PACEART REMOTE DEVICE CHECK
Battery Remaining Longevity: 49 mo
Battery Voltage: 2.96 V
Brady Statistic AP VP Percent: 12.67 %
Brady Statistic AP VS Percent: 0 %
Brady Statistic AS VP Percent: 87.32 %
Brady Statistic AS VS Percent: 0.01 %
Brady Statistic RA Percent Paced: 12.66 %
Brady Statistic RV Percent Paced: 99.99 %
Date Time Interrogation Session: 20240409065007
Implantable Lead Connection Status: 753985
Implantable Lead Connection Status: 753985
Implantable Lead Implant Date: 20111031
Implantable Lead Implant Date: 20111031
Implantable Lead Location: 753859
Implantable Lead Location: 753860
Implantable Pulse Generator Implant Date: 20200116
Lead Channel Impedance Value: 361 Ohm
Lead Channel Impedance Value: 380 Ohm
Lead Channel Impedance Value: 456 Ohm
Lead Channel Impedance Value: 513 Ohm
Lead Channel Pacing Threshold Amplitude: 0.5 V
Lead Channel Pacing Threshold Amplitude: 2.5 V
Lead Channel Pacing Threshold Pulse Width: 0.4 ms
Lead Channel Pacing Threshold Pulse Width: 0.4 ms
Lead Channel Sensing Intrinsic Amplitude: 2.625 mV
Lead Channel Sensing Intrinsic Amplitude: 2.625 mV
Lead Channel Sensing Intrinsic Amplitude: 20.75 mV
Lead Channel Sensing Intrinsic Amplitude: 20.75 mV
Lead Channel Setting Pacing Amplitude: 1.5 V
Lead Channel Setting Pacing Amplitude: 3.5 V
Lead Channel Setting Pacing Pulse Width: 0.6 ms
Lead Channel Setting Sensing Sensitivity: 2.8 mV
Zone Setting Status: 755011
Zone Setting Status: 755011

## 2022-12-22 NOTE — Progress Notes (Signed)
Remote pacemaker transmission.   

## 2023-02-13 ENCOUNTER — Ambulatory Visit (INDEPENDENT_AMBULATORY_CARE_PROVIDER_SITE_OTHER): Payer: Medicaid Other

## 2023-02-13 DIAGNOSIS — I442 Atrioventricular block, complete: Secondary | ICD-10-CM | POA: Diagnosis not present

## 2023-02-13 LAB — CUP PACEART REMOTE DEVICE CHECK
Battery Remaining Longevity: 44 mo
Battery Voltage: 2.95 V
Brady Statistic AP VP Percent: 15.04 %
Brady Statistic AP VS Percent: 0 %
Brady Statistic AS VP Percent: 84.96 %
Brady Statistic AS VS Percent: 0 %
Brady Statistic RA Percent Paced: 15.02 %
Brady Statistic RV Percent Paced: 100 %
Date Time Interrogation Session: 20240709044352
Implantable Lead Connection Status: 753985
Implantable Lead Connection Status: 753985
Implantable Lead Implant Date: 20111031
Implantable Lead Implant Date: 20111031
Implantable Lead Location: 753859
Implantable Lead Location: 753860
Implantable Pulse Generator Implant Date: 20200116
Lead Channel Impedance Value: 361 Ohm
Lead Channel Impedance Value: 380 Ohm
Lead Channel Impedance Value: 399 Ohm
Lead Channel Impedance Value: 475 Ohm
Lead Channel Pacing Threshold Amplitude: 0.375 V
Lead Channel Pacing Threshold Amplitude: 2.5 V
Lead Channel Pacing Threshold Pulse Width: 0.4 ms
Lead Channel Pacing Threshold Pulse Width: 0.4 ms
Lead Channel Sensing Intrinsic Amplitude: 2.625 mV
Lead Channel Sensing Intrinsic Amplitude: 2.625 mV
Lead Channel Sensing Intrinsic Amplitude: 20.125 mV
Lead Channel Sensing Intrinsic Amplitude: 20.125 mV
Lead Channel Setting Pacing Amplitude: 1.5 V
Lead Channel Setting Pacing Amplitude: 3.5 V
Lead Channel Setting Pacing Pulse Width: 0.6 ms
Lead Channel Setting Sensing Sensitivity: 2.8 mV
Zone Setting Status: 755011
Zone Setting Status: 755011

## 2023-02-22 ENCOUNTER — Telehealth: Payer: Self-pay

## 2023-02-22 NOTE — Telephone Encounter (Signed)
Attempted to contact patient to reschedule device clinic apt. No answer, LMTCB.  When patient returns, its fine to reschedule her apt. To next available that works alongside her schedule.

## 2023-02-22 NOTE — Telephone Encounter (Signed)
Pt has a appt 03/01/2023 and wants to know if she can push it out due to her not being able to take the day off? Pt would like a call back from the nurse

## 2023-02-23 NOTE — Telephone Encounter (Signed)
Noted in EPIC apt. Moved to 03/05/23.

## 2023-03-01 ENCOUNTER — Ambulatory Visit: Payer: Medicaid Other

## 2023-03-05 ENCOUNTER — Ambulatory Visit: Payer: Medicaid Other

## 2023-03-05 DIAGNOSIS — I442 Atrioventricular block, complete: Secondary | ICD-10-CM | POA: Diagnosis not present

## 2023-03-05 LAB — CUP PACEART INCLINIC DEVICE CHECK
Date Time Interrogation Session: 20240729170400
Implantable Lead Connection Status: 753985
Implantable Lead Connection Status: 753985
Implantable Lead Implant Date: 20111031
Implantable Lead Implant Date: 20111031
Implantable Lead Location: 753859
Implantable Lead Location: 753860
Implantable Pulse Generator Implant Date: 20200116

## 2023-03-05 NOTE — Progress Notes (Signed)
Pacemaker check in clinic for q94mth monitoring of RV threshold. Normal device function. Thresholds, sensing, impedances consistent with previous measurements. Device programmed to maximize longevity. AT/AF burden <.1%, no high ventricular rates noted. Device programmed at appropriate safety margins. Histogram distribution appropriate for patient activity level. Patient enrolled in ongoing remote follow-up. Estimated longevity after programming change today:  3 years. (3.7 years before). Will schedule to recheck lead again when patient sees Dr. Elberta Fortis in November for her routine follow up.    RV threshold tested at various pulse widths and programming changes made to support adequate safety margin: 1.  1.72ms at 1.6ms results in best outcome and supports 2:1 safety margin. RV threshold in January measured 1.5 @ 0.64ms.  2.  Programming change extended RV pulse width from 0.5ms to 1.25ms. 3.  Programmed RV output is fixed 3.5 @ 1.64ms.   RV sensing and lead impedances are stable and WNL.

## 2023-03-06 ENCOUNTER — Telehealth: Payer: Self-pay

## 2023-03-06 NOTE — Telephone Encounter (Signed)
Patient needs routine follow up and device ck RV threhsold, with Dr. Elberta Fortis in [place].    Can you schedule/call patient and set up for November appointment with [facility] in [place]? We can check her device at that appt time as well.   Thanks.

## 2023-03-08 NOTE — Progress Notes (Signed)
Remote pacemaker transmission.   

## 2023-05-15 ENCOUNTER — Ambulatory Visit (INDEPENDENT_AMBULATORY_CARE_PROVIDER_SITE_OTHER): Payer: Medicaid Other

## 2023-05-15 DIAGNOSIS — I442 Atrioventricular block, complete: Secondary | ICD-10-CM | POA: Diagnosis not present

## 2023-05-16 LAB — CUP PACEART REMOTE DEVICE CHECK
Battery Remaining Longevity: 29 mo
Battery Voltage: 2.93 V
Brady Statistic AP VP Percent: 19.74 %
Brady Statistic AP VS Percent: 0 %
Brady Statistic AS VP Percent: 80.25 %
Brady Statistic AS VS Percent: 0.01 %
Brady Statistic RA Percent Paced: 19.72 %
Brady Statistic RV Percent Paced: 99.99 %
Date Time Interrogation Session: 20241008062831
Implantable Lead Connection Status: 753985
Implantable Lead Connection Status: 753985
Implantable Lead Implant Date: 20111031
Implantable Lead Implant Date: 20111031
Implantable Lead Location: 753859
Implantable Lead Location: 753860
Implantable Pulse Generator Implant Date: 20200116
Lead Channel Impedance Value: 361 Ohm
Lead Channel Impedance Value: 380 Ohm
Lead Channel Impedance Value: 380 Ohm
Lead Channel Impedance Value: 456 Ohm
Lead Channel Pacing Threshold Amplitude: 0.5 V
Lead Channel Pacing Threshold Amplitude: 2.5 V
Lead Channel Pacing Threshold Pulse Width: 0.4 ms
Lead Channel Pacing Threshold Pulse Width: 0.4 ms
Lead Channel Sensing Intrinsic Amplitude: 2.625 mV
Lead Channel Sensing Intrinsic Amplitude: 2.625 mV
Lead Channel Sensing Intrinsic Amplitude: 21.375 mV
Lead Channel Sensing Intrinsic Amplitude: 21.375 mV
Lead Channel Setting Pacing Amplitude: 1.5 V
Lead Channel Setting Pacing Amplitude: 3.5 V
Lead Channel Setting Pacing Pulse Width: 1 ms
Lead Channel Setting Sensing Sensitivity: 2.8 mV
Zone Setting Status: 755011
Zone Setting Status: 755011

## 2023-06-04 NOTE — Progress Notes (Signed)
Remote pacemaker transmission.   

## 2023-07-16 ENCOUNTER — Encounter: Payer: Self-pay | Admitting: Cardiology

## 2023-07-16 ENCOUNTER — Ambulatory Visit: Payer: Medicaid Other | Attending: Cardiology | Admitting: Cardiology

## 2023-07-16 VITALS — BP 114/64 | HR 67 | Ht 65.0 in | Wt 167.6 lb

## 2023-07-16 DIAGNOSIS — I441 Atrioventricular block, second degree: Secondary | ICD-10-CM

## 2023-07-16 DIAGNOSIS — I471 Supraventricular tachycardia, unspecified: Secondary | ICD-10-CM

## 2023-07-16 LAB — CUP PACEART INCLINIC DEVICE CHECK
Battery Remaining Longevity: 24 mo
Battery Voltage: 2.93 V
Brady Statistic AP VP Percent: 18.59 %
Brady Statistic AP VS Percent: 0 %
Brady Statistic AS VP Percent: 81.4 %
Brady Statistic AS VS Percent: 0.01 %
Brady Statistic RA Percent Paced: 18.57 %
Brady Statistic RV Percent Paced: 99.99 %
Date Time Interrogation Session: 20241209101847
Implantable Lead Connection Status: 753985
Implantable Lead Connection Status: 753985
Implantable Lead Implant Date: 20111031
Implantable Lead Implant Date: 20111031
Implantable Lead Location: 753859
Implantable Lead Location: 753860
Implantable Pulse Generator Implant Date: 20200116
Lead Channel Impedance Value: 323 Ohm
Lead Channel Impedance Value: 361 Ohm
Lead Channel Impedance Value: 399 Ohm
Lead Channel Impedance Value: 418 Ohm
Lead Channel Pacing Threshold Amplitude: 0.5 V
Lead Channel Pacing Threshold Amplitude: 0.5 V
Lead Channel Pacing Threshold Amplitude: 2.5 V
Lead Channel Pacing Threshold Amplitude: 2.5 V
Lead Channel Pacing Threshold Amplitude: 2.5 V
Lead Channel Pacing Threshold Pulse Width: 0.4 ms
Lead Channel Pacing Threshold Pulse Width: 0.4 ms
Lead Channel Pacing Threshold Pulse Width: 0.4 ms
Lead Channel Pacing Threshold Pulse Width: 1 ms
Lead Channel Pacing Threshold Pulse Width: 1 ms
Lead Channel Sensing Intrinsic Amplitude: 2.375 mV
Lead Channel Sensing Intrinsic Amplitude: 20.25 mV
Lead Channel Sensing Intrinsic Amplitude: 21.375 mV
Lead Channel Sensing Intrinsic Amplitude: 3.25 mV
Lead Channel Setting Pacing Amplitude: 1.5 V
Lead Channel Setting Pacing Amplitude: 5 V
Lead Channel Setting Pacing Pulse Width: 1 ms
Lead Channel Setting Sensing Sensitivity: 2.8 mV
Zone Setting Status: 755011
Zone Setting Status: 755011

## 2023-07-16 NOTE — Patient Instructions (Signed)
Medication Instructions:  Your physician recommends that you continue on your current medications as directed. Please refer to the Current Medication list given to you today.  *If you need a refill on your cardiac medications before your next appointment, please call your pharmacy*   Lab Work: None ordered   Testing/Procedures: None ordered   Follow-Up: At [facility], you and your health needs are our priority.  As part of our continuing mission to provide you with exceptional heart care, we have created designated Provider Care Teams.  These Care Teams include your primary Cardiologist (physician) and Advanced Practice Providers (APPs -  Physician Assistants and Nurse Practitioners) who all work together to provide you with the care you need, when you need it.   Your next appointment:   08/30/2022   The format for your next appointment:   In Person  Provider:   Nobie Putnam, MD{           To discuss RV lead extraction  Thank you for choosing [facility]!!   Dory Horn, RN [phone]

## 2023-07-16 NOTE — Progress Notes (Signed)
  Electrophysiology Office Note:   Date:  07/16/2023  ID:  Lori Gay, DOB [birth year], MRN [id number]  Primary Cardiologist: None Electrophysiologist: Lori Jou Lori Loa, MD      History of Present Illness:   Lori Gay is a 62 y.o. female with h/o AVNRT post ablation, second-degree AV block Seen today for routine electrophysiology followup.   Since last being seen in our clinic the patient reports doing overall well.  She has no chest pain or shortness of breath.  She is able to do all of her daily activities without restriction.  Happy with her control.  Her RV lead threshold has been increasing.  She is in sinus rhythm with a ventricular escape in the 30s today.  she denies chest pain, palpitations, dyspnea, PND, orthopnea, nausea, vomiting, dizziness, syncope, edema, weight gain, or early satiety.   Review of systems complete and found to be negative unless listed in HPI.      EP Information / Studies Reviewed:    EKG is ordered today. Personal review as below.      PPM Interrogation-  reviewed in detail today,  See PACEART report.  Device History: Medtronic Dual Chamber PPM implanted for Second Degree AV block  Risk Assessment/Calculations:              Physical Exam:   VS:  BP 114/64   Pulse 67   Ht 5\' 5"  (1.651 m)   Wt 167 lb 9.6 oz (76 kg)   SpO2 96%   BMI 27.89 kg/m    Wt Readings from Last 3 Encounters:  07/16/23 167 lb 9.6 oz (76 kg)  07/10/22 170 lb (77.1 kg)  05/11/21 165 lb (74.8 kg)     GEN: Well nourished, well developed in no acute distress NECK: No JVD; No carotid bruits CARDIAC: Regular rate and rhythm, no murmurs, rubs, gallops RESPIRATORY:  Clear to auscultation without rales, wheezing or rhonchi  ABDOMEN: Soft, non-tender, non-distended EXTREMITIES:  No edema; No deformity   ASSESSMENT AND PLAN:    Second Degree AV block s/p Medtronic PPM  Normal PPM function See Pace Art report RV threshold increasing.  Have set output to 5 mV at  1 ms.  She Lori Gay need RV lead extraction with reimplantation.  I have discussed this with Dr. Jimmey Gay who Lori Gay see her in clinic.  2.  AVNRT: Post ablation.  No obvious recurrence.  Disposition:   Follow up with Dr. Jimmey Gay  next available   Signed, Lori Gay Lori Loa, MD

## 2023-08-14 ENCOUNTER — Ambulatory Visit (INDEPENDENT_AMBULATORY_CARE_PROVIDER_SITE_OTHER): Payer: Medicaid Other

## 2023-08-14 DIAGNOSIS — I442 Atrioventricular block, complete: Secondary | ICD-10-CM

## 2023-08-15 LAB — CUP PACEART REMOTE DEVICE CHECK
Battery Remaining Longevity: 16 mo
Battery Voltage: 2.9 V
Brady Statistic AP VP Percent: 11.35 %
Brady Statistic AP VS Percent: 0 %
Brady Statistic AS VP Percent: 88.6 %
Brady Statistic AS VS Percent: 0.05 %
Brady Statistic RA Percent Paced: 11.34 %
Brady Statistic RV Percent Paced: 99.95 %
Date Time Interrogation Session: 20250107004654
Implantable Lead Connection Status: 753985
Implantable Lead Connection Status: 753985
Implantable Lead Implant Date: 20111031
Implantable Lead Implant Date: 20111031
Implantable Lead Location: 753859
Implantable Lead Location: 753860
Implantable Pulse Generator Implant Date: 20200116
Lead Channel Impedance Value: 380 Ohm
Lead Channel Impedance Value: 380 Ohm
Lead Channel Impedance Value: 418 Ohm
Lead Channel Impedance Value: 475 Ohm
Lead Channel Pacing Threshold Amplitude: 0.5 V
Lead Channel Pacing Threshold Amplitude: 2.5 V
Lead Channel Pacing Threshold Pulse Width: 0.4 ms
Lead Channel Pacing Threshold Pulse Width: 0.4 ms
Lead Channel Sensing Intrinsic Amplitude: 2.5 mV
Lead Channel Sensing Intrinsic Amplitude: 2.5 mV
Lead Channel Sensing Intrinsic Amplitude: 20.375 mV
Lead Channel Sensing Intrinsic Amplitude: 20.375 mV
Lead Channel Setting Pacing Amplitude: 1.5 V
Lead Channel Setting Pacing Amplitude: 5 V
Lead Channel Setting Pacing Pulse Width: 1 ms
Lead Channel Setting Sensing Sensitivity: 2.8 mV
Zone Setting Status: 755011
Zone Setting Status: 755011

## 2023-08-31 ENCOUNTER — Ambulatory Visit: Payer: Medicaid Other | Admitting: Cardiology

## 2023-09-06 ENCOUNTER — Ambulatory Visit: Payer: Medicaid Other | Attending: Cardiology | Admitting: Cardiology

## 2023-09-06 VITALS — BP 122/80 | HR 67 | Ht 65.0 in | Wt 164.2 lb

## 2023-09-06 DIAGNOSIS — I442 Atrioventricular block, complete: Secondary | ICD-10-CM

## 2023-09-06 DIAGNOSIS — T82110D Breakdown (mechanical) of cardiac electrode, subsequent encounter: Secondary | ICD-10-CM | POA: Diagnosis not present

## 2023-09-06 DIAGNOSIS — Z950 Presence of cardiac pacemaker: Secondary | ICD-10-CM

## 2023-09-06 NOTE — Patient Instructions (Signed)
Medication Instructions:  Your physician recommends that you continue on your current medications as directed. Please refer to the Current Medication list given to you today.  *If you need a refill on your cardiac medications before your next appointment, please call your pharmacy*  Lab Work: BMET and CBC prior to your procedure  Testing/Procedures: Cardiac CT Your physician has requested that you have cardiac CT. Cardiac computed tomography (CT) is a painless test that uses an x-ray machine to take clear, detailed pictures of your heart. For further information please visit [email or web address] Please follow instruction sheet as given.  Pacemaker Lead Extraction  We will call you to schedule your procedure  Follow-Up: At [facility], you and your health needs are our priority.  As part of our continuing mission to provide you with exceptional heart care, we have created designated Provider Care Teams.  These Care Teams include your primary Cardiologist (physician) and Advanced Practice Providers (APPs -  Physician Assistants and Nurse Practitioners) who all work together to provide you with the care you need, when you need it.

## 2023-09-06 NOTE — Progress Notes (Signed)
Electrophysiology Office Note:   Date:  09/06/2023  ID:  Lori Gay, DOB 1960-09-06, MRN [id number]  Primary Cardiologist: None Electrophysiologist: Will Jorja Loa, MD      History of Present Illness:   Lori Gay is a 63 y.o. female with h/o h/o AVNRT post ablation, second-degree AV block s/p right sided dual chamber pacemaker (2011) who is being seen today for evaluation for lead extraction.  Discussed the use of AI scribe software for clinical note transcription with the patient, who gave verbal consent to proceed.  History of Present Illness   The patient, a 63 year old individual with a history of complete heart block, presents for consultation regarding their pacemaker. The RV pacing lead, which was implanted in 2011, has an increasing capture threshold, leading to a faster battery depletion. She had a fall down ten stairs, fracturing vertebrae. She wonders if this fall may have damaged her pacemaker. She is quite active at work. She is otherwise doing well and has no new or acute complaints today.      Review of systems complete and found to be negative unless listed in HPI.   EP Information / Studies Reviewed:    EKG is not ordered today. EKG from 07/16/23 reviewed which showed atrial sensed, ventricular paced rhythm.      Physical Exam:   VS:  BP 122/80   Pulse 67   Ht 5\' 5"  (1.651 m)   Wt 164 lb 3.2 oz (74.5 kg)   SpO2 96%   BMI 27.32 kg/m    Wt Readings from Last 3 Encounters:  09/06/23 164 lb 3.2 oz (74.5 kg)  07/16/23 167 lb 9.6 oz (76 kg)  07/10/22 170 lb (77.1 kg)     GEN: Well nourished, well developed in no acute distress NECK: No JVD CARDIAC: Normal rate, regular rhythm, right chest pocket well healed RESPIRATORY:  Clear to auscultation without rales, wheezing or rhonchi  ABDOMEN: Soft, non-tender, non-distended EXTREMITIES:  No edema; No deformity   ASSESSMENT AND PLAN:   Lori Gay is a 63 y.o. female with h/o h/o AVNRT post ablation,  second-degree AV block s/p right sided dual chamber pacemaker (2011) who is being seen today for evaluation for lead extraction.    The patient is pacemaker dependent. She has an escape rhythm today of 44bpm during device check. Her RV lead capture threshold has been trending up, leading to reduced lifespan of her battery. She had generator change in 2020 and has 1 year of battery left. Given her age, if no intervention is performed, she will need numerous generator changes over the rest of her life, each increasing risk of infection. For this reason, patient was advised to replace her current RV lead. Discussed risks and benefits of abandoning the current RV lead and adding a new one versus extraction and implantation of a new RV lead.  Abandoning the lead would result in loss of MRI compatibility and increased infection risk due to presence of extra hardware in the bloodstream and pocket. Extraction, while more complex and carrying a small risk of more serious complications, would maintain MRI compatibility and minimize infection risk moving forward. Risks (including but not limited to bleeding, infection, damage to heart or lungs, heart attack, stroke, vascular injury requiring surgical repair or death) and benefits of each option discussed extensively and patient would like to move forward with planning for RV lead extraction.   #Complete heart block s/p dual chamber pacemaker: #RV pacing lead malfunction: - Schedule pre-extraction CT scan of the  chest. - Repeat echocardiogram. - Coordinate extraction procedure with cardiac surgery backup. - Will plan to replace RV pacemaker lead and generator during the procedure. - In clinic device check as follows:  Appropriate device function RA - threshold 0.5V @0 .4ms, sensing 2.35mV, impedance 418ohms RV - threshold 2.75V @0 .4ms and 2.25V @1 .0ms, sensing >47mV, impedance 437ohms  Signed, Nobie Putnam, MD

## 2023-09-10 ENCOUNTER — Telehealth: Payer: Self-pay

## 2023-09-10 DIAGNOSIS — I442 Atrioventricular block, complete: Secondary | ICD-10-CM

## 2023-09-10 NOTE — Telephone Encounter (Signed)
Pt is scheduled for PPM Gen Change, RV lead extraction and reimplant with Dr. Ladona Ridgel on 2/19 at 3:00 PM.   She will go to [place] office for labs.   She has an Echo appt at [facility] office on 2/10 and will get Instruction letter and surgical scrub at that time. I will put it at front desk.

## 2023-09-12 LAB — CBC WITH DIFFERENTIAL/PLATELET
Basophils Absolute: 0 10*3/uL (ref 0.0–0.2)
Basos: 0 %
EOS (ABSOLUTE): 0.1 10*3/uL (ref 0.0–0.4)
Eos: 2 %
Hematocrit: 42.6 % (ref 34.0–46.6)
Hemoglobin: 14.3 g/dL (ref 11.1–15.9)
Immature Grans (Abs): 0 10*3/uL (ref 0.0–0.1)
Immature Granulocytes: 0 %
Lymphocytes Absolute: 1 10*3/uL (ref 0.7–3.1)
Lymphs: 19 %
MCH: 31.2 pg (ref 26.6–33.0)
MCHC: 33.6 g/dL (ref 31.5–35.7)
MCV: 93 fL (ref 79–97)
Monocytes Absolute: 0.5 10*3/uL (ref 0.1–0.9)
Monocytes: 9 %
Neutrophils Absolute: 3.4 10*3/uL (ref 1.4–7.0)
Neutrophils: 70 %
Platelets: 171 10*3/uL (ref 150–450)
RBC: 4.59 x10E6/uL (ref 3.77–5.28)
RDW: 12.6 % (ref 11.7–15.4)
WBC: 5 10*3/uL (ref 3.4–10.8)

## 2023-09-12 LAB — BASIC METABOLIC PANEL
BUN/Creatinine Ratio: 20 (ref 12–28)
BUN: 17 mg/dL (ref 8–27)
CO2: 23 mmol/L (ref 20–29)
Calcium: 9.1 mg/dL (ref 8.7–10.3)
Chloride: 107 mmol/L — ABNORMAL HIGH (ref 96–106)
Creatinine, Ser: 0.83 mg/dL (ref 0.57–1.00)
Glucose: 95 mg/dL (ref 70–99)
Potassium: 4.5 mmol/L (ref 3.5–5.2)
Sodium: 144 mmol/L (ref 134–144)
eGFR: 80 mL/min/{1.73_m2} (ref >59)

## 2023-09-17 ENCOUNTER — Encounter (HOSPITAL_COMMUNITY): Payer: Self-pay

## 2023-09-17 ENCOUNTER — Telehealth: Payer: Self-pay | Admitting: Internal Medicine

## 2023-09-17 ENCOUNTER — Other Ambulatory Visit (HOSPITAL_COMMUNITY): Payer: Medicaid Other

## 2023-09-17 NOTE — Telephone Encounter (Signed)
 Patient called in this morning to reschedule her echo for today 2/10 due to sickness. Next available for an echo is 2/25 - patient has been rescheduled. Just wanted to let you know incase this affects her surgery on 2/25.

## 2023-09-20 ENCOUNTER — Telehealth (HOSPITAL_COMMUNITY): Payer: Self-pay | Admitting: *Deleted

## 2023-09-20 ENCOUNTER — Ambulatory Visit (HOSPITAL_BASED_OUTPATIENT_CLINIC_OR_DEPARTMENT_OTHER): Payer: Medicaid Other

## 2023-09-20 DIAGNOSIS — I442 Atrioventricular block, complete: Secondary | ICD-10-CM

## 2023-09-20 LAB — ECHOCARDIOGRAM COMPLETE
Area-P 1/2: 4.06 cm2
S' Lateral: 2 cm

## 2023-09-20 NOTE — Telephone Encounter (Signed)
Reaching out to patient to offer assistance regarding upcoming cardiac imaging study; pt verbalizes understanding of appt date/time, parking situation and where to check in, pre-test NPO status and medications ordered, and verified current allergies; name and call back number provided for further questions should they arise Johney Frame RN Navigator Cardiac Imaging [facility] [phone] office [phone] cell

## 2023-09-21 ENCOUNTER — Encounter: Payer: Self-pay | Admitting: Cardiology

## 2023-09-21 ENCOUNTER — Ambulatory Visit (HOSPITAL_COMMUNITY)
Admission: RE | Admit: 2023-09-21 | Discharge: 2023-09-21 | Disposition: A | Discharge status: Home / Self Care | Payer: Medicaid Other | Source: Ambulatory Visit | Attending: Cardiology | Admitting: Cardiology

## 2023-09-21 DIAGNOSIS — I442 Atrioventricular block, complete: Secondary | ICD-10-CM

## 2023-09-21 MED ORDER — IOHEXOL 350 MG/ML SOLN
90.0000 mL | Freq: Once | INTRAVENOUS | Status: AC | PRN
  Administered 2023-09-21: 90 mL via INTRAVENOUS

## 2023-09-21 NOTE — Progress Notes (Signed)
IV attempt by Silvio Pate RN. Successful but during flushing with NS Left 3th to fifth fingers felt on fire. IV d/c. Now hurts above elbow and feels tight and like can't straighten arm. Ice pack to site. PA  W Vanita Ingles in to see patient and assessed left arm. OK to have IV inserted left forearm but not antecubital. Checked with CT and they are OK with IV mid forearm, #20. Will place IV team consult.

## 2023-09-25 ENCOUNTER — Other Ambulatory Visit: Payer: Self-pay

## 2023-09-25 ENCOUNTER — Other Ambulatory Visit (HOSPITAL_COMMUNITY): Payer: Medicaid Other

## 2023-09-25 ENCOUNTER — Encounter (HOSPITAL_COMMUNITY): Payer: Self-pay | Admitting: Internal Medicine

## 2023-09-25 NOTE — Progress Notes (Signed)
 PCP - Leane Call., PA-C Cardiologist - EP- Dr. Loman [place]  PPM/ICD - PPM- [employer] Office contacts rep.  Chest x-ray - 09/19/23 EKG - 07/16/23 Stress Test - 12/25/14 ECHO - 09/20/23 Cardiac Cath - denies  CPAP - denies  DM- denies  ASA/Blood Thinner Instructions: n/a   ERAS Protcol - no, NPO  COVID TEST- n/a  Anesthesia review: yes.   Patient verbally denies any shortness of breath, fever, and chest pain during phone call. Pt said she was recently sick, had chest x-ray 2/12, finished course of erythromycin Sunday. She said she is concerned that she still has a lingering cough (non productive now, but was before) and she doesn't want her cough to be an issue with the site healing, or for it to make it super painful when she coughs. Lori Gay, anesthesia PA, made aware. Lori Gay, CMA, made aware.     Questions were answered. Patient verbalized understanding of instructions.

## 2023-09-26 ENCOUNTER — Other Ambulatory Visit: Payer: Self-pay

## 2023-09-26 ENCOUNTER — Telehealth: Payer: Self-pay

## 2023-09-26 DIAGNOSIS — I442 Atrioventricular block, complete: Secondary | ICD-10-CM

## 2023-09-26 DIAGNOSIS — Z01818 Encounter for other preprocedural examination: Secondary | ICD-10-CM

## 2023-09-26 NOTE — Telephone Encounter (Signed)
 Due to pt being sick recently her PPM Gen change and Lead Extraction was rescheduled from 2/19 out to 3/7 at 10:30 am.  She will go to [place] office for updated labs on 2/28.  I will update her Instruction letter and send via MyChart.

## 2023-09-27 NOTE — Addendum Note (Signed)
 Addended by: Geralyn Flash D on: 09/27/2023 11:39 AM   Modules accepted: Orders

## 2023-09-27 NOTE — Progress Notes (Signed)
 Remote pacemaker transmission.

## 2023-10-01 ENCOUNTER — Other Ambulatory Visit: Payer: Medicaid Other

## 2023-10-02 ENCOUNTER — Other Ambulatory Visit: Payer: Medicaid Other

## 2023-10-06 LAB — BASIC METABOLIC PANEL
BUN/Creatinine Ratio: 24 (ref 12–28)
BUN: 17 mg/dL (ref 8–27)
CO2: 23 mmol/L (ref 20–29)
Calcium: 9 mg/dL (ref 8.7–10.3)
Chloride: 106 mmol/L (ref 96–106)
Creatinine, Ser: 0.72 mg/dL (ref 0.57–1.00)
Glucose: 89 mg/dL (ref 70–99)
Potassium: 4.4 mmol/L (ref 3.5–5.2)
Sodium: 143 mmol/L (ref 134–144)
eGFR: 95 mL/min/{1.73_m2} (ref >59)

## 2023-10-06 LAB — CBC
Hematocrit: 41.8 % (ref 34.0–46.6)
Hemoglobin: 14 g/dL (ref 11.1–15.9)
MCH: 31.4 pg (ref 26.6–33.0)
MCHC: 33.5 g/dL (ref 31.5–35.7)
MCV: 94 fL (ref 79–97)
Platelets: 201 10*3/uL (ref 150–450)
RBC: 4.46 x10E6/uL (ref 3.77–5.28)
RDW: 13 % (ref 11.7–15.4)
WBC: 4.1 10*3/uL (ref 3.4–10.8)

## 2023-10-10 ENCOUNTER — Ambulatory Visit: Payer: Medicaid Other

## 2023-10-11 ENCOUNTER — Encounter (HOSPITAL_COMMUNITY): Payer: Self-pay | Admitting: Internal Medicine

## 2023-10-11 ENCOUNTER — Other Ambulatory Visit: Payer: Self-pay

## 2023-10-11 NOTE — Anesthesia Preprocedure Evaluation (Addendum)
 Anesthesia Evaluation  Patient identified by MRN, date of birth, ID band Patient awake    Reviewed: Allergy & Precautions, H&P , NPO status , Patient's Chart, lab work & pertinent test results  Airway Mallampati: II   Neck ROM: full    Dental   Pulmonary former smoker   breath sounds clear to auscultation       Cardiovascular + dysrhythmias + pacemaker  Rhythm:regular Rate:Normal     Neuro/Psych  PSYCHIATRIC DISORDERS Anxiety        GI/Hepatic   Endo/Other    Renal/GU stones     Musculoskeletal  (+) Arthritis ,    Abdominal   Peds  Hematology   Anesthesia Other Findings   Reproductive/Obstetrics                             Anesthesia Physical Anesthesia Plan  ASA: 3  Anesthesia Plan: General   Post-op Pain Management:    Induction: Intravenous  PONV Risk Score and Plan: 3 and Ondansetron, Dexamethasone, Treatment may vary due to age or medical condition and Midazolam  Airway Management Planned: Oral ETT  Additional Equipment: Arterial line and TEE  Intra-op Plan:   Post-operative Plan:   Informed Consent: I have reviewed the patients History and Physical, chart, labs and discussed the procedure including the risks, benefits and alternatives for the proposed anesthesia with the patient or authorized representative who has indicated his/her understanding and acceptance.     Dental advisory given  Plan Discussed with: CRNA, Anesthesiologist and Surgeon  Anesthesia Plan Comments: (PAT note written 10/11/2023 by Shonna Chock, PA-C.  )       Anesthesia Quick Evaluation

## 2023-10-11 NOTE — Progress Notes (Signed)
 Anesthesia Chart Review: Lori Gay  Case: [id number] Date/Time: 10/12/23 1030   Procedures:      LEAD EXTRACTION     PPM GENERATOR CHANGEOUT   Anesthesia type: General   Pre-op diagnosis: lead malfunction   [facility] CATH [facility] 6 / [facility] INVASIVE CV [facility]   Providers: Marinus Maw, MD       DISCUSSION: Patient is a 63 year old female scheduled for the above procedure. She has had a PPM since 2011. RV lead thresholds have been increasing and leading to faster battery depletion. Above procedure planned.   History includes former smoker (quit 01/28/15), AVNRT (s/p SVT ablation 06/02/10), 2nd/3rd degree AV block (s/p Medtronic dual chamber PPM 06/06/10, generator exchange 08/22/18)  Anesthesia team to evaluate on the day of surgery. CT surgeon back-up is Dr. Evelene Croon.  VS: Ht 5\' 4"  (1.626 m)   Wt 73 kg   BMI 27.62 kg/m  BP Readings from Last 3 Encounters:  09/21/23 125/80  09/06/23 122/80  07/16/23 114/64   Pulse Readings from Last 3 Encounters:  09/06/23 67  07/16/23 67  07/10/22 76     PROVIDERS: Nodal, Joline Salt, PA-C is PCP  Elberta Fortis, Will, MD is EP cardiologist   LABS: Most recent lab results in [facility] include: Lab Results  Component Value Date   WBC 4.1 10/05/2023   HGB 14.0 10/05/2023   HCT 41.8 10/05/2023   PLT 201 10/05/2023   GLUCOSE 89 10/05/2023   NA 143 10/05/2023   K 4.4 10/05/2023   CL 106 10/05/2023   CREATININE 0.72 10/05/2023   BUN 17 10/05/2023   CO2 23 10/05/2023   EKG: 07/16/23: Atrial-sensed ventricular-paced rhythm Abnormal ECG When compared with ECG of 22-Aug-2018 10:32, No significant change since last tracing Confirmed by Camnitz, Will ([id number]) on 07/16/2023 9:31:10 AM  CV: Echo 09/20/23: IMPRESSIONS   1. Left ventricular ejection fraction, by estimation, is 55 to 60%. Left  ventricular ejection fraction by 3D volume is 55 %. The left ventricle has  normal function. The left ventricle has no regional wall motion  abnormalities.  Left ventricular diastolic   parameters were normal. The average left ventricular global longitudinal  strain is -22.0 %. The global longitudinal strain is normal.   2. Right ventricular systolic function is normal. The right ventricular  size is normal. There is normal pulmonary artery systolic pressure. The  estimated right ventricular systolic pressure is 25.8 mmHg.   3. The mitral valve is normal in structure. Trivial mitral valve  regurgitation. No evidence of mitral stenosis.   4. The aortic valve is normal in structure. Aortic valve regurgitation is  not visualized. No aortic stenosis is present.   5. The inferior vena cava is normal in size with greater than 50%  respiratory variability, suggesting right atrial pressure of 3 mmHg.   Nuclear stress test 12/25/14: Small area of inferior, apical and septal wall infarction no ischemia.   Baseline ECG P sync pacing Not clear role this plays in septal defect Surface images with normal EF 60%   Past Medical History:  Diagnosis Date   Anxiety    AVNRT (AV nodal re-entry tachycardia) (HCC)    s/p slow pathway ablation by Dr Johney Frame   DDD (degenerative disc disease)    back surgery x2   History of kidney stones    Presence of permanent cardiac pacemaker    Second degree AV block    s/p MDT pacemaker by Dr Graciela Husbands with subsequent generator change  Tobacco user     Past Surgical History:  Procedure Laterality Date   ATRIAL ABLATION SURGERY  05/07/2010   slow pathway; for inducible AVNRT   PACEMAKER INSERTION  06/07/2010   for AV block post ablation; Medtronic Revo by Dr Graciela Husbands   [facility] GENERATOR CHANGEOUT N/A 08/22/2018   Procedure: PPM GENERATOR CHANGEOUT;  Surgeon: Hillis Range, MD;  Location: [facility] INVAS[facility] CV LAB;  Service: Cardiovascular;  Laterality: N/A;   TONSILLECTOMY     TYMPANOSTOMY TUBE PLACEMENT     as a child and then one as an adult in left ear    MEDICATIONS: No current facility-administered medications for this  encounter.    celecoxib (CELEBREX) 100 MG capsule   cetirizine (ZYRTEC) 10 MG tablet   clonazePAM (KLONOPIN) 1 MG tablet   gabapentin (NEURONTIN) 300 MG capsule   KRILL OIL PO   rosuvastatin (CRESTOR) 5 MG tablet   Shonna Chock, PA-C Surgical Short Stay/Anesthesiology [facility] Phone [phone] [facility] Phone [phone] 10/11/2023 10:21 AM

## 2023-10-11 NOTE — Progress Notes (Signed)
 PCP - Nodal, Joline Salt, PA-C  Cardiologist - Regan Lemming, MD   PPM/ICD - Pacemaker Device Orders - n/a Rep Notified - not by PAT  Chest x-ray -  EKG - 07-16-23 Stress Test -  ECHO - 09-20-23 Cardiac Cath -   CPAP - denies  DM - denies  Blood Thinner Instructions: denies Aspirin Instructions: n/a  ERAS Protcol - NPO  COVID TEST- n/a  Anesthesia review: yes  Patient verbally denies any shortness of breath, fever, cough and chest pain during phone call   -------------  SDW INSTRUCTIONS given:  Your procedure is scheduled on October 12, 2023.  Report to [facility] Main Entrance "A" at 8:30 A.M., and check in at the Admitting office.  Call this number if you have problems the morning of surgery:  [phone]   Remember:  Do not eat or drink after midnight the night before your surgery      Take these medicines the morning of surgery with A SIP OF WATER  NO MEDS PER INSTRUCTIONS FROM DR. TAYLOR    As of today, STOP taking any Aspirin (unless otherwise instructed by your surgeon) Aleve, Naproxen, Ibuprofen, Motrin, Advil, Goody's, BC's, all herbal medications, fish oil, and all vitamins.                      Do not wear jewelry, make up, or nail polish            Do not wear lotions, powders, perfumes/colognes, or deodorant.            Do not shave 48 hours prior to surgery.  Men may shave face and neck.            Do not bring valuables to the hospital.            [facility] is not responsible for any belongings or valuables.  Do NOT Smoke (Tobacco/Vaping) 24 hours prior to your procedure If you use a CPAP at night, you may bring all equipment for your overnight stay.   Contacts, glasses, dentures or bridgework may not be worn into surgery.      For patients admitted to the hospital, discharge time will be determined by your treatment team.   Patients discharged the day of surgery will not be allowed to drive home, and someone needs to stay with them  for 24 hours.    Special instructions:   [place]- Preparing For Surgery  Before surgery, you can play an important role. Because skin is not sterile, your skin needs to be as free of germs as possible. You can reduce the number of germs on your skin by washing with CHG (chlorahexidine gluconate) Soap before surgery.  CHG is an antiseptic cleaner which kills germs and bonds with the skin to continue killing germs even after washing.    Oral Hygiene is also important to reduce your risk of infection.  Remember - BRUSH YOUR TEETH THE MORNING OF SURGERY WITH YOUR REGULAR TOOTHPASTE  Please do not use if you have an allergy to CHG or antibacterial soaps. If your skin becomes reddened/irritated stop using the CHG.  Do not shave (including legs and underarms) for at least 48 hours prior to first CHG shower. It is OK to shave your face.  Please follow these instructions carefully.   Shower the NIGHT BEFORE SURGERY and the MORNING OF SURGERY with DIAL Soap.   Pat yourself dry with a CLEAN TOWEL.  Wear CLEAN PAJAMAS  to bed the night before surgery  Place CLEAN SHEETS on your bed the night of your first shower and DO NOT SLEEP WITH PETS.   Day of Surgery: Please shower morning of surgery  Wear Clean/Comfortable clothing the morning of surgery Do not apply any deodorants/lotions.   Remember to brush your teeth WITH YOUR REGULAR TOOTHPASTE.   Questions were answered. Patient verbalized understanding of instructions.

## 2023-10-12 ENCOUNTER — Encounter (HOSPITAL_COMMUNITY)
Admission: RE | Disposition: A | Discharge status: Home / Self Care | Payer: Medicaid Other | Source: Home / Self Care | Attending: Cardiology

## 2023-10-12 ENCOUNTER — Other Ambulatory Visit: Payer: Self-pay

## 2023-10-12 ENCOUNTER — Ambulatory Visit (HOSPITAL_COMMUNITY)
Admission: RE | Admit: 2023-10-12 | Discharge: 2023-10-13 | Disposition: A | Discharge status: Home / Self Care | Payer: Medicaid Other | Attending: Cardiology | Admitting: Cardiology

## 2023-10-12 ENCOUNTER — Ambulatory Visit (HOSPITAL_COMMUNITY)

## 2023-10-12 ENCOUNTER — Ambulatory Visit (HOSPITAL_BASED_OUTPATIENT_CLINIC_OR_DEPARTMENT_OTHER): Payer: Self-pay | Admitting: Physician Assistant

## 2023-10-12 ENCOUNTER — Ambulatory Visit (HOSPITAL_COMMUNITY): Payer: Self-pay | Admitting: Physician Assistant

## 2023-10-12 DIAGNOSIS — F419 Anxiety disorder, unspecified: Secondary | ICD-10-CM

## 2023-10-12 DIAGNOSIS — X58XXXA Exposure to other specified factors, initial encounter: Secondary | ICD-10-CM | POA: Insufficient documentation

## 2023-10-12 DIAGNOSIS — Z87891 Personal history of nicotine dependence: Secondary | ICD-10-CM | POA: Diagnosis not present

## 2023-10-12 DIAGNOSIS — M1990 Unspecified osteoarthritis, unspecified site: Secondary | ICD-10-CM | POA: Diagnosis not present

## 2023-10-12 DIAGNOSIS — T82110A Breakdown (mechanical) of cardiac electrode, initial encounter: Secondary | ICD-10-CM | POA: Insufficient documentation

## 2023-10-12 DIAGNOSIS — Z01818 Encounter for other preprocedural examination: Secondary | ICD-10-CM

## 2023-10-12 DIAGNOSIS — I442 Atrioventricular block, complete: Secondary | ICD-10-CM | POA: Diagnosis not present

## 2023-10-12 DIAGNOSIS — I471 Supraventricular tachycardia, unspecified: Secondary | ICD-10-CM | POA: Diagnosis not present

## 2023-10-12 DIAGNOSIS — Z950 Presence of cardiac pacemaker: Secondary | ICD-10-CM

## 2023-10-12 HISTORY — DX: Personal history of urinary calculi: Z87.442

## 2023-10-12 HISTORY — PX: PPM GENERATOR CHANGEOUT: EP1233

## 2023-10-12 HISTORY — DX: Presence of cardiac pacemaker: Z95.0

## 2023-10-12 HISTORY — DX: Anxiety disorder, unspecified: F41.9

## 2023-10-12 HISTORY — PX: TRANSESOPHAGEAL ECHOCARDIOGRAM (CATH LAB): EP1270

## 2023-10-12 HISTORY — PX: LEAD EXTRACTION: EP1211

## 2023-10-12 LAB — ECHO INTRAOPERATIVE TEE
Height: 64 in
Weight: 2592 [oz_av]

## 2023-10-12 LAB — CBC
HCT: 45.2 % (ref 36.0–46.0)
Hemoglobin: 15.1 g/dL — ABNORMAL HIGH (ref 12.0–15.0)
MCH: 31.1 pg (ref 26.0–34.0)
MCHC: 33.4 g/dL (ref 30.0–36.0)
MCV: 93 fL (ref 80.0–100.0)
Platelets: 167 10*3/uL (ref 150–400)
RBC: 4.86 MIL/uL (ref 3.87–5.11)
RDW: 13 % (ref 11.5–15.5)
WBC: 4.4 10*3/uL (ref 4.0–10.5)
nRBC: 0 % (ref 0.0–0.2)

## 2023-10-12 LAB — BASIC METABOLIC PANEL
Anion gap: 10 (ref 5–15)
BUN: 12 mg/dL (ref 8–23)
CO2: 21 mmol/L — ABNORMAL LOW (ref 22–32)
Calcium: 9.1 mg/dL (ref 8.9–10.3)
Chloride: 111 mmol/L (ref 98–111)
Creatinine, Ser: 0.85 mg/dL (ref 0.44–1.00)
GFR, Estimated: >60 mL/min (ref >60)
Glucose, Bld: 81 mg/dL (ref 70–99)
Potassium: 3.8 mmol/L (ref 3.5–5.1)
Sodium: 142 mmol/L (ref 135–145)

## 2023-10-12 LAB — SURGICAL PCR SCREEN
MRSA, PCR: NEGATIVE
Staphylococcus aureus: NEGATIVE

## 2023-10-12 LAB — ABO/RH: ABO/RH(D): O POS

## 2023-10-12 LAB — PREPARE RBC (CROSSMATCH)

## 2023-10-12 LAB — GLUCOSE, CAPILLARY: Glucose-Capillary: 190 mg/dL — ABNORMAL HIGH (ref 70–99)

## 2023-10-12 SURGERY — LEAD EXTRACTION
Anesthesia: General

## 2023-10-12 MED ORDER — PHENYLEPHRINE HCL-NACL 20-0.9 MG/250ML-% IV SOLN
INTRAVENOUS | Status: DC | PRN
  Administered 2023-10-12: 20 ug/min via INTRAVENOUS
  Administered 2023-10-12: 25 ug/min via INTRAVENOUS

## 2023-10-12 MED ORDER — FENTANYL CITRATE (PF) 100 MCG/2ML IJ SOLN
INTRAMUSCULAR | Status: AC
  Filled 2023-10-12: qty 2

## 2023-10-12 MED ORDER — ACETAMINOPHEN 325 MG PO TABS
325.0000 mg | ORAL_TABLET | ORAL | Status: DC | PRN
  Administered 2023-10-13: 650 mg via ORAL
  Filled 2023-10-12: qty 2

## 2023-10-12 MED ORDER — MIDAZOLAM HCL 2 MG/2ML IJ SOLN
INTRAMUSCULAR | Status: AC
  Filled 2023-10-12: qty 2

## 2023-10-12 MED ORDER — SODIUM CHLORIDE 0.9 % IV SOLN
80.0000 mg | INTRAVENOUS | Status: AC
  Administered 2023-10-12: 80 mg

## 2023-10-12 MED ORDER — LIDOCAINE 2% (20 MG/ML) 5 ML SYRINGE
INTRAMUSCULAR | Status: DC | PRN
Start: 2023-10-12 — End: 2023-10-12
  Administered 2023-10-12: 60 mg via INTRAVENOUS

## 2023-10-12 MED ORDER — ONDANSETRON HCL 4 MG/2ML IJ SOLN
INTRAMUSCULAR | Status: AC
  Filled 2023-10-12: qty 2

## 2023-10-12 MED ORDER — ACETAMINOPHEN 500 MG PO TABS
1000.0000 mg | ORAL_TABLET | Freq: Once | ORAL | Status: AC
  Administered 2023-10-12: 1000 mg via ORAL
  Filled 2023-10-12: qty 2

## 2023-10-12 MED ORDER — ROSUVASTATIN CALCIUM 5 MG PO TABS
5.0000 mg | ORAL_TABLET | Freq: Every day | ORAL | Status: DC
  Administered 2023-10-12 – 2023-10-13 (×2): 5 mg via ORAL
  Filled 2023-10-12 (×2): qty 1

## 2023-10-12 MED ORDER — EPHEDRINE SULFATE-NACL 50-0.9 MG/10ML-% IV SOSY
PREFILLED_SYRINGE | INTRAVENOUS | Status: DC | PRN
  Administered 2023-10-12 (×2): 2.5 mg via INTRAVENOUS
  Administered 2023-10-12: 5 mg via INTRAVENOUS

## 2023-10-12 MED ORDER — SODIUM CHLORIDE 0.9% IV SOLUTION: Freq: Once | INTRAVENOUS | Status: DC

## 2023-10-12 MED ORDER — ROCURONIUM BROMIDE 10 MG/ML (PF) SYRINGE
PREFILLED_SYRINGE | INTRAVENOUS | Status: DC | PRN
  Administered 2023-10-12: 20 mg via INTRAVENOUS
  Administered 2023-10-12: 10 mg via INTRAVENOUS
  Administered 2023-10-12: 50 mg via INTRAVENOUS

## 2023-10-12 MED ORDER — PHENYLEPHRINE 80 MCG/ML (10ML) SYRINGE FOR IV PUSH (FOR BLOOD PRESSURE SUPPORT)
PREFILLED_SYRINGE | INTRAVENOUS | Status: DC | PRN
  Administered 2023-10-12 (×4): 80 ug via INTRAVENOUS
  Administered 2023-10-12: 160 ug via INTRAVENOUS
  Administered 2023-10-12 (×3): 80 ug via INTRAVENOUS

## 2023-10-12 MED ORDER — LACTATED RINGERS IV SOLN: INTRAVENOUS | Status: DC | PRN

## 2023-10-12 MED ORDER — CEFAZOLIN SODIUM-DEXTROSE 2-4 GM/100ML-% IV SOLN
2.0000 g | INTRAVENOUS | Status: AC
  Administered 2023-10-12: 2 g via INTRAVENOUS
  Filled 2023-10-12: qty 100

## 2023-10-12 MED ORDER — ONDANSETRON HCL 4 MG/2ML IJ SOLN
4.0000 mg | Freq: Four times a day (QID) | INTRAMUSCULAR | Status: DC | PRN
  Administered 2023-10-12 – 2023-10-13 (×3): 4 mg via INTRAVENOUS
  Filled 2023-10-12 (×2): qty 2

## 2023-10-12 MED ORDER — SODIUM CHLORIDE 0.9 % IV SOLN
INTRAVENOUS | Status: AC
  Filled 2023-10-12: qty 2

## 2023-10-12 MED ORDER — PROPOFOL 10 MG/ML IV BOLUS
INTRAVENOUS | Status: DC | PRN
  Administered 2023-10-12: 140 mg via INTRAVENOUS

## 2023-10-12 MED ORDER — CEFAZOLIN SODIUM-DEXTROSE 2-4 GM/100ML-% IV SOLN
INTRAVENOUS | Status: AC
  Filled 2023-10-12: qty 100

## 2023-10-12 MED ORDER — GABAPENTIN 300 MG PO CAPS
300.0000 mg | ORAL_CAPSULE | Freq: Three times a day (TID) | ORAL | Status: DC
  Administered 2023-10-12 – 2023-10-13 (×2): 300 mg via ORAL
  Filled 2023-10-12 (×2): qty 1

## 2023-10-12 MED ORDER — CHLORHEXIDINE GLUCONATE 0.12 % MT SOLN
OROMUCOSAL | Status: AC
  Administered 2023-10-12: 15 mL
  Filled 2023-10-12: qty 15

## 2023-10-12 MED ORDER — POVIDONE-IODINE 10 % EX SWAB
2.0000 | Freq: Once | CUTANEOUS | Status: AC
  Administered 2023-10-12: 2 via TOPICAL

## 2023-10-12 MED ORDER — MIDAZOLAM HCL 2 MG/2ML IJ SOLN
INTRAMUSCULAR | Status: DC | PRN
  Administered 2023-10-12: 2 mg via INTRAVENOUS

## 2023-10-12 MED ORDER — ONDANSETRON HCL 4 MG/2ML IJ SOLN
INTRAMUSCULAR | Status: DC | PRN
Start: 2023-10-12 — End: 2023-10-12
  Administered 2023-10-12: 4 mg via INTRAVENOUS

## 2023-10-12 MED ORDER — FENTANYL CITRATE (PF) 100 MCG/2ML IJ SOLN
25.0000 ug | INTRAMUSCULAR | Status: DC | PRN
  Administered 2023-10-12: 50 ug via INTRAVENOUS

## 2023-10-12 MED ORDER — CHLORHEXIDINE GLUCONATE 4 % EX SOLN: 4.0000 | Freq: Once | CUTANEOUS | Status: DC

## 2023-10-12 MED ORDER — HYDROCODONE-ACETAMINOPHEN 5-325 MG PO TABS
1.0000 | ORAL_TABLET | Freq: Three times a day (TID) | ORAL | Status: DC | PRN
  Administered 2023-10-12: 1 via ORAL
  Filled 2023-10-12: qty 1

## 2023-10-12 MED ORDER — FENTANYL CITRATE (PF) 100 MCG/2ML IJ SOLN
INTRAMUSCULAR | Status: DC | PRN
  Administered 2023-10-12: 100 ug via INTRAVENOUS

## 2023-10-12 MED ORDER — SUGAMMADEX SODIUM 200 MG/2ML IV SOLN
INTRAVENOUS | Status: DC | PRN
  Administered 2023-10-12: 150 mg via INTRAVENOUS

## 2023-10-12 MED ORDER — CHLORHEXIDINE GLUCONATE 0.12 % MT SOLN
OROMUCOSAL | Status: AC
Start: 2023-10-12 — End: 2023-10-12
  Filled 2023-10-12: qty 15

## 2023-10-12 MED ORDER — SODIUM CHLORIDE 0.9 % IV SOLN: INTRAVENOUS | Status: DC

## 2023-10-12 MED ORDER — DEXAMETHASONE SODIUM PHOSPHATE 10 MG/ML IJ SOLN
INTRAMUSCULAR | Status: DC | PRN
  Administered 2023-10-12: 5 mg via INTRAVENOUS

## 2023-10-12 MED ORDER — HEPARIN (PORCINE) IN NACL 1000-0.9 UT/500ML-% IV SOLN
INTRAVENOUS | Status: DC | PRN
Start: 2023-10-12 — End: 2023-10-12
  Administered 2023-10-12: 500 mL

## 2023-10-12 MED ORDER — SODIUM CHLORIDE 0.9 % IV SOLN
INTRAVENOUS | Status: DC
Start: 2023-10-12 — End: 2023-10-12

## 2023-10-12 SURGICAL SUPPLY — 27 items
CABLE SURGICAL S-101-97-12 (CABLE) ×2 IMPLANT
CATH RIGHTSITE C315HIS02 (CATHETERS) IMPLANT
CATH S G BIP PACING (CATHETERS) IMPLANT
COIL ONE TIE COMPRESSION (VASCULAR PRODUCTS) IMPLANT
DEVICE LCKNG LEAD CARDIAC (CATHETERS) IMPLANT
IPG PACE AZUR XT DR MRI W1DR01 (Pacemaker) IMPLANT
KIT LEAD ACCESSORY 6056 PINCH (KITS) IMPLANT
KIT MICROPUNCTURE NIT STIFF (SHEATH) IMPLANT
KIT STYLET 52CM (MISCELLANEOUS) ×1 IMPLANT
KIT WRENCH (KITS) IMPLANT
LEAD CAPSURE NOVUS 5076-52CM (Lead) IMPLANT
LEAD EXTR SH CNTRL-R SHEATH 11 (SHEATH) IMPLANT
LEAD SELECT SECURE 3830 383069 (Lead) IMPLANT
PACE AZURE XT DR MRI W1DR01 (Pacemaker) ×1 IMPLANT
PAD DEFIB RADIO PHYSIO CONN (PAD) ×2 IMPLANT
POUCH AIGIS-R ANTIBACT PPM (Mesh General) ×1 IMPLANT
POUCH AIGIS-R ANTIBACT PPM MED (Mesh General) IMPLANT
REMOVAL LLD CARDIAC LEAD EZ (CATHETERS) ×2 IMPLANT
SELECT SECURE 3830 383069 (Lead) ×1 IMPLANT
SHEATH 7FR PRELUDE SNAP 13 (SHEATH) IMPLANT
SHEATH EVOLUTION RL 13F (SHEATH) IMPLANT
SHEATH PINNACLE 6F 10CM (SHEATH) IMPLANT
SHEATH TIGHTRAIL MECH 11F (SHEATH) IMPLANT
SHEATH TIGHTRAIL MINI 11F (SHEATH) IMPLANT
SLITTER 6232ADJ (MISCELLANEOUS) IMPLANT
TRAY PACEMAKER INSERTION (PACKS) ×2 IMPLANT
WIRE HI TORQ VERSACORE-J 145CM (WIRE) IMPLANT

## 2023-10-12 NOTE — Progress Notes (Signed)
 LOCATION: left RADIAL  DRESSING APPLIED: gauze with tegaderm  SITE UPON ARRIVAL: LEVEL 0  SITE AFTER BAND REMOVAL: LEVEL 0  CIRCULATION SENSATION AND MOVEMENT: + movement, +2 radial pulse  COMMENTS:

## 2023-10-12 NOTE — Progress Notes (Signed)
 SITE AREA: left groin/femoral  SITE PRIOR TO REMOVAL:  LEVEL 0  PRESSURE APPLIED FOR: approximately 12 minutes  MANUAL: yes  PATIENT STATUS DURING PULL: stable   POST PULL SITE:  LEVEL 0  POST PULL INSTRUCTIONS GIVEN: yes, see previous documentation on right groin sheath removal  POST PULL PULSES PRESENT: bilateral pedal pulses at +2  DRESSING APPLIED: gauze with tegaderm  BEDREST BEGINS @ 1702   COMMENTS:

## 2023-10-12 NOTE — Discharge Instructions (Addendum)
 After Your Pacemaker  (pacemaker system extraction with new implant)   You have a [employer]onic Pacemaker  ACTIVITY Do not lift your arm above shoulder height for 1 week after your procedure. After 7 days, you may progress as below.  You should remove your sling 24 hours after your procedure, unless otherwise instructed by your provider.     Friday October 19, 2023  Saturday October 20, 2023 Sunday October 21, 2023 Monday October 22, 2023   Do not lift, push, pull, or carry anything over 10 pounds with the affected arm until 6 weeks (Friday November 23, 2023 ) after your procedure.   You may drive AFTER your wound check, unless you have been told otherwise by your provider.   Ask your healthcare provider when you can go back to work   INCISION/Dressing If you are on a blood thinner such as Coumadin, Xarelto, Eliquis, Plavix, or Pradaxa please confirm with your provider when this should be resumed.   If large square, outer bandage is left in place, this can be removed after 24 hours from your procedure. Do not remove steri-strips or glue as below.   If a PRESSURE DRESSING (a bulky dressing that usually goes up over your shoulder) was applied or left in place, please follow instructions given by your provider on when to return to have this removed.   Monitor your Pacemaker site for redness, swelling, and drainage. Call the device clinic at [phone] if you experience these symptoms or fever/chills.  If your incision is sealed with Steri-strips or staples, you may shower 7 days after your procedure or when told by your provider. Do not remove the steri-strips or let the shower hit directly on your site. You may wash around your site with soap and water.    If you were discharged in a sling, please do not wear this during the day more than 48 hours after your surgery unless otherwise instructed. This may increase the risk of stiffness and soreness in your shoulder.   Avoid lotions, ointments, or  perfumes over your incision until it is well-healed.  You may use a hot tub or a pool AFTER your wound check appointment if the incision is completely closed.  Pacemaker Alerts:  Some alerts are vibratory and others beep. These are NOT emergencies. Please call our office to let us know. If this occurs at night or on weekends, it can wait until the next business day. Send a remote transmission.  If your device is capable of reading fluid status (for heart failure), you will be offered monthly monitoring to review this with you.   DEVICE MANAGEMENT Remote monitoring is used to monitor your pacemaker from home. This monitoring is scheduled every 91 days by our office. It allows [place] to keep an eye on the functioning of your device to ensure it is working properly. You will routinely see your Electrophysiologist annually (more often if necessary).   You should receive your ID card for your new device in 4-8 weeks. Keep this card with you at all times once received. Consider wearing a medical alert bracelet or necklace.  Your Pacemaker may be MRI compatible. This will be discussed at your next office visit/wound check.  You should avoid contact with strong electric or magnetic fields.   Do not use amateur (ham) radio equipment or electric (arc) welding torches. MP3 player headphones with magnets should not be used. Some devices are safe to use if held at least 12 inches (30 cm) from  your Pacemaker. These include power tools, lawn mowers, and speakers. If you are unsure if something is safe to use, ask your health care provider.  When using your cell phone, hold it to the ear that is on the opposite side from the Pacemaker. Do not leave your cell phone in a pocket over the Pacemaker.  You may safely use electric blankets, heating pads, computers, and microwave ovens.  Call the office right away if: You have chest pain. You feel more short of breath than you have felt before. You feel more  light-headed than you have felt before. Your incision starts to open up.  This information is not intended to replace advice given to you by your health care provider. Make sure you discuss any questions you have with your health care provider.

## 2023-10-12 NOTE — H&P (Signed)
 Electrophysiology Office Note:   Date:  09/06/2023  ID:  Lori Gay, DOB 1961-06-10, MRN [id number]   Primary Cardiologist: None Electrophysiologist: Lori Jorja Loa, MD       History of Present Illness:   Lori Gay is a 63 y.o. female with h/o h/o AVNRT post ablation, second-degree AV block s/p right sided dual chamber pacemaker (2011) who is being seen today for evaluation for lead extraction.   Discussed the use of AI scribe software for clinical note transcription with the patient, who gave verbal consent to proceed.   History of Present Illness   The patient, a 63 year old individual with a history of complete heart block, presents for consultation regarding their pacemaker. The RV pacing lead, which was implanted in 2011, has an increasing capture threshold, leading to a faster battery depletion. She had a fall down ten stairs, fracturing vertebrae. She wonders if this fall may have damaged her pacemaker. She is quite active at work. She is otherwise doing well and has no new or acute complaints today.       Review of systems complete and found to be negative unless listed in HPI.    EP Information / Studies Reviewed:     EKG is not ordered today. EKG from 07/16/23 reviewed which showed atrial sensed, ventricular paced rhythm.        Physical Exam:   VS:  BP 122/80   Pulse 67   Ht 5\' 5"  (1.651 m)   Wt 164 lb 3.2 oz (74.5 kg)   SpO2 96%   BMI 27.32 kg/m       Wt Readings from Last 3 Encounters:  09/06/23 164 lb 3.2 oz (74.5 kg)  07/16/23 167 lb 9.6 oz (76 kg)  07/10/22 170 lb (77.1 kg)      GEN: Well nourished, well developed in no acute distress NECK: No JVD CARDIAC: Normal rate, regular rhythm, right chest pocket well healed RESPIRATORY:  Clear to auscultation without rales, wheezing or rhonchi  ABDOMEN: Soft, non-tender, non-distended EXTREMITIES:  No edema; No deformity    ASSESSMENT AND PLAN:   Lori Gay is a 63 y.o. female with h/o h/o AVNRT  post ablation, second-degree AV block s/p right sided dual chamber pacemaker (2011) who is being seen today for evaluation for lead extraction.    The patient is pacemaker dependent. She has an escape rhythm today of 44bpm during device check. Her RV lead capture threshold has been trending up, leading to reduced lifespan of her battery. She had generator change in 2020 and has 1 year of battery left. Given her age, if no intervention is performed, she Lori need numerous generator changes over the rest of her life, each increasing risk of infection. For this reason, patient was advised to replace her current RV lead. Discussed risks and benefits of abandoning the current RV lead and adding a new one versus extraction and implantation of a new RV lead.  Abandoning the lead would result in loss of MRI compatibility and increased infection risk due to presence of extra hardware in the bloodstream and pocket. Extraction, while more complex and carrying a small risk of more serious complications, would maintain MRI compatibility and minimize infection risk moving forward. Risks (including but not limited to bleeding, infection, damage to heart or lungs, heart attack, stroke, vascular injury requiring surgical repair or death) and benefits of each option discussed extensively and patient would like to move forward with planning for RV lead extraction.    #Complete heart block  s/p dual chamber pacemaker: #RV pacing lead malfunction: - Schedule pre-extraction CT scan of the chest. - Repeat echocardiogram. - Coordinate extraction procedure with cardiac surgery backup. - Lori plan to replace RV pacemaker lead and generator during the procedure. - In clinic device check as follows:  Appropriate device function RA - threshold 0.5V @0 .4ms, sensing 2.19mV, impedance 418ohms RV - threshold 2.75V @0 .4ms and 2.25V @1 .0ms, sensing >79mV, impedance 437ohms   Signed, Lori Putnam, MD   EP Attending  Patient seen and  examined. Agree with above. The patient has a failing RV lead and is device dependent. I have reviewed the treatment options. We Lori plan insertion of a new RV lead and extraction of the old lead. The risks/benefits/goals/expectations of the procedure wre reviewed with the patient and her daughter and she wishes to proceed.   Lori Gowda Heyden Jaber,MD

## 2023-10-12 NOTE — Progress Notes (Signed)
      [address]       [place] [address]             [phone]      I provided on site surgical backup for Dr. Ladona Ridgel during Ms. Parran's lead extraction.  Total time 76 minutes  Viviann Spare C. Dorris Fetch, MD [facility] [phone]

## 2023-10-12 NOTE — Progress Notes (Signed)
 SITE AREA: right groin/femoral  SITE PRIOR TO REMOVAL:  LEVEL 0  PRESSURE APPLIED FOR: approximately 12 minutes  MANUAL: yes  PATIENT STATUS DURING PULL: stable, see MAR for nausea and pain meds given  POST PULL SITE:  LEVEL 0  POST PULL INSTRUCTIONS GIVEN: yes, drsgs x 24 hours, may shower tomorrow, no sitting in hot tubs, bath tubs, or pools x 1 week, climb easy on stairs and into and out of vehicles, if any bleeding or wet/moist feeling around groin sites/legs, please notify staff, if bleeding occurs at home please call 911  POST PULL PULSES PRESENT: bilateral pedal pulses at +2  DRESSING APPLIED: gauze with tegaderm  BEDREST BEGINS @ after next left groin sheath removal, see note  COMMENTS:

## 2023-10-12 NOTE — Anesthesia Procedure Notes (Signed)
 Procedure Name: Intubation Date/Time: 10/12/2023 12:14 PM  Performed by: Yolonda Kida, CRNAPre-anesthesia Checklist: Patient identified, Emergency Drugs available, Suction available and Patient being monitored Patient Re-evaluated:Patient Re-evaluated prior to induction Oxygen Delivery Method: Circle System Utilized Preoxygenation: Pre-oxygenation with 100% oxygen Induction Type: IV induction Ventilation: Mask ventilation without difficulty Laryngoscope Size: Mac and 3 Grade View: Grade I Tube type: Oral Tube size: 8.0 mm Number of attempts: 1 Airway Equipment and Method: Stylet Placement Confirmation: ETT inserted through vocal cords under direct vision, positive ETCO2 and breath sounds checked- equal and bilateral Secured at: 22 cm Tube secured with: Tape Dental Injury: Teeth and Oropharynx as per pre-operative assessment

## 2023-10-12 NOTE — Progress Notes (Addendum)
 Accu-check completed at this time and resulted at 190, pt with c/o feeling hot, sweating noted, cool wash cloth previously placed to forehead upon arrival to cath lab holding, blankets removed, see flowsheets for VS and MAR for medications given, safety maintained

## 2023-10-12 NOTE — Progress Notes (Addendum)
 Purewick placed per pt request to void, Bedrest remains in place, peri care given, tolerated well  A-line to left wrist in place upon arrival to cath lab holding, [place] 20, not connected to monitor, blue arm board in place, a-line to be removed, level 0, safety maintained

## 2023-10-12 NOTE — Transfer of Care (Signed)
 Immediate Anesthesia Transfer of Care Note  Patient: Lori Gay  Procedure(s) Performed: LEAD EXTRACTION PPM GENERATOR CHANGEOUT TRANSESOPHAGEAL ECHOCARDIOGRAM  Patient Location: PACU  Anesthesia Type:General  Level of Consciousness: awake, alert , oriented, and patient cooperative  Airway & Oxygen Therapy: Patient Spontanous Breathing and Patient connected to nasal cannula oxygen  Post-op Assessment: Report given to RN and Post -op Vital signs reviewed and stable  Post vital signs: Reviewed and stable  Last Vitals:  Vitals Value Taken Time  BP 118/77 10/12/23 1545  Temp    Pulse 106 10/12/23 1549  Resp 16 10/12/23 1547  SpO2 94 % 10/12/23 1549  Vitals shown include unfiled device data.  Last Pain:  Vitals:   10/12/23 0928  TempSrc:   PainSc: 0-No pain         Complications: No notable events documented.

## 2023-10-13 ENCOUNTER — Ambulatory Visit (HOSPITAL_COMMUNITY)

## 2023-10-13 DIAGNOSIS — T82110A Breakdown (mechanical) of cardiac electrode, initial encounter: Secondary | ICD-10-CM | POA: Diagnosis not present

## 2023-10-13 DIAGNOSIS — I442 Atrioventricular block, complete: Secondary | ICD-10-CM | POA: Diagnosis not present

## 2023-10-13 DIAGNOSIS — Z87891 Personal history of nicotine dependence: Secondary | ICD-10-CM | POA: Diagnosis not present

## 2023-10-13 DIAGNOSIS — F419 Anxiety disorder, unspecified: Secondary | ICD-10-CM | POA: Diagnosis not present

## 2023-10-13 MED ORDER — ACETAMINOPHEN 325 MG PO TABS
325.0000 mg | ORAL_TABLET | ORAL | Status: DC | PRN
Start: 2023-10-13 — End: 2023-12-21

## 2023-10-13 NOTE — Plan of Care (Signed)
 Patient is discharged. DC instruction provided. Problem: Education: Goal: Knowledge of cardiac device and self-care will improve Outcome: Completed/Met Goal: Ability to safely manage health related needs after discharge will improve Outcome: Completed/Met Goal: Individualized Educational Video(s) Outcome: Completed/Met   Problem: Cardiac: Goal: Ability to achieve and maintain adequate cardiopulmonary perfusion will improve Outcome: Completed/Met   Problem: Education: Goal: Knowledge of General Education information will improve Description: Including pain rating scale, medication(s)/side effects and non-pharmacologic comfort measures Outcome: Completed/Met   Problem: Health Behavior/Discharge Planning: Goal: Ability to manage health-related needs will improve Outcome: Completed/Met   Problem: Clinical Measurements: Goal: Ability to maintain clinical measurements within normal limits will improve Outcome: Completed/Met Goal: Will remain free from infection Outcome: Completed/Met Goal: Diagnostic test results will improve Outcome: Completed/Met Goal: Respiratory complications will improve Outcome: Completed/Met Goal: Cardiovascular complication will be avoided Outcome: Completed/Met   Problem: Activity: Goal: Risk for activity intolerance will decrease Outcome: Completed/Met   Problem: Nutrition: Goal: Adequate nutrition will be maintained Outcome: Completed/Met   Problem: Coping: Goal: Level of anxiety will decrease Outcome: Completed/Met   Problem: Elimination: Goal: Will not experience complications related to bowel motility Outcome: Completed/Met Goal: Will not experience complications related to urinary retention Outcome: Completed/Met   Problem: Pain Managment: Goal: General experience of comfort will improve and/or be controlled Outcome: Completed/Met   Problem: Safety: Goal: Ability to remain free from injury will improve Outcome: Completed/Met    Problem: Skin Integrity: Goal: Risk for impaired skin integrity will decrease Outcome: Completed/Met

## 2023-10-13 NOTE — Discharge Summary (Signed)
 Discharge Summary    Patient ID: Lori Gay MRN: [id number]; DOB: Jul 03, 1961  Admit date: 10/12/2023 Discharge date: 10/13/2023  PCP:  Leane Call, PA-C   [facility] Providers Cardiologist:  None  Electrophysiologist:  Will Jorja Loa, MD       Discharge Diagnoses    Principal Problem:   Pacemaker lead malfunction    Diagnostic Studies/Procedures    LEAD EXTRACTION: 10/12/2023 PROCEDURES:  1. RV lead extraction 2. RA lead extraction 3. Implantation of a new dual chamber permanent pacemaker system  INTRODUCTION: Lori Gay is a 63 y.o. patient with history of complete heart block s/p dual chamber pacemaker complicated by RV lead malfunction who presents to the EP lab for PPM implant.   After verification of informed consent and answering all questions, the patient arrived to the EP lab in an atrial sensed and ventricular paced rhythm. The procedure was performed under general anesthesia. The patient was prepped and draped in the usual sterile fashion. Intravascular access was obtained in the right femoral vein for potential backup pacing and bridge balloon. Baseline TEE was performed showing no pericardial effusion.  The existing pocket in the right pre-pectoral area was incised, and the existing device was delivered. The device was disconnected from the leads, and the leads were dissected to the tie down sleeves. The pocket appeared healthy, with no evidence of active infection. There was moderate pocket fibrosis. The left axillary vein was accessed using a micropuncture needle and wire under fluoroscopic guidance. Standard straight stylets were then passed down the RA and the RV leads. The active fixation screw on the RV pacing lead was retracted. The RV lead was then cut and prepped for extraction using a locking stylet and One-Tie. Using a TightRail mechanical tool, the RV lead was successfully extracted. There was significant lead to lead binding, and  in process of removing the RV lead, the RA lead was dislodged. The RA lead was then cut and prepped for extraction using a locking stylet and One-Tie. The TightRail mechanical tool was then used to successfully extract the RA lead.   After extraction of both leads, the blood pressure remained stable, fluoroscopy showed normal motion of the heart border, and TEE showed no pericardial effusion. After assuring hemostasis, we moved to reimplantation of a new dual chamber pacing system. Over a Wholey wire, a septal sheath was passed to the mid septum. An RV lead (model [id number], serial [id number]) was passed through the sheath and fixed to the RV septum. There it displayed excelling pacing (0.7 V at 0.33ms) and sensing thresholds (8.8 mV) with an acceptable impedance (927 ohms). The lead was secured to the pectoral fascia. Next, the right atrial lead (model [id number], serial [id number]) was advanced to the RA appendage where it displayed excellent pacing (0.4 V at 0.56ms) and sensing (2.4 mV) thresholds with an acceptable impedance (665 ohms). It was secured to the pectoral fascia. The pocket was irrigated with copious gentamicin solution. The leads were then connected to a pulse generator (model MDT Azure XT DR MRI SureScan, serial [id number]). The pocket was closed in layers of absorbable suture. EBL < 10mL. Steri-strips and a sterile dressing were applied. The pocket was irrigated copiously with gentamicin solution.   The femoral venous sheath was then pulled, and hemostasis achieved with manual pressure. The patient tolerated the procedure well, and she was transferred to the recovery area in stable condition.   CONCLUSIONS:   1. Successful extraction of malfunctioning RV pacing lead.  2. Extraction of RA pacing lead.   3. Successful implantation of new right sided dual chamber pacemaker with LBBAP lead.   4.  No early apparent complications.    Nobie Putnam, MD Cardiac Electrophysiology  _____________    History of Present Illness     Lori Gay is a 63 y.o. 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  Pt was having an increasing capture threshold, leading to a faster battery depletion. She had a fall down ten stairs, fracturing vertebrae. She wonders if this fall may have damaged her pacemaker.   She was evaluated by Dr Ladona Ridgel. The patient has a failing RV lead and is device dependent. I have reviewed the treatment options. We will plan insertion of a new RV lead and extraction of the old lead. The risks/benefits/goals/expectations of the procedure wre reviewed with the patient and her daughter and she wishes to proceed.   Hospital Course     Consultants: none   She came to the hospital for the procedure on 03/07.  The procedure results are above, she tolerated it well.   On 03/08, she was seen by Dr Nelly Laurence and all data were reviewed. No further inpatient workup is indicated and she is considered stable for d/c, to follow up as an outpatient.      Discharge Vitals Blood pressure (!) 104/59, pulse 71, temperature 97.6 F (36.4 C), temperature source Oral, resp. rate 19, height 5\' 4"  (1.626 m), weight 73.5 kg, SpO2 97%.  Filed Weights   09/25/23 1333 10/11/23 0837 10/12/23 0827  Weight: 73 kg 73 kg 73.5 kg  General: Well developed, well nourished, female in no acute distress Head: Eyes PERRLA, Head normocephalic and atraumatic Lungs: clear bilaterally to auscultation. Heart: HRRR, S1 S2, without rub or gallop. No murmur. 4/4 extremity pulses are 2+ & equal. No JVD. Abdomen: Bowel sounds are present, abdomen soft and non-tender without masses or  hernias noted. Msk: Normal strength and tone for age. Extremities: No clubbing, cyanosis or edema.    Skin:  No rashes or lesions noted. Neuro: Alert and oriented X 3. Psych:  Good affect, responds appropriately   Labs & Radiologic Studies    CBC Recent Labs     10/12/23 0923  WBC 4.4  HGB 15.1*  HCT 45.2  MCV 93.0  PLT 167   Basic Metabolic Panel Recent Labs    82/95/62 0923  NA 142  K 3.8  CL 111  CO2 21*  GLUCOSE 81  BUN 12  CREATININE 0.85  CALCIUM 9.1   Liver Function Tests No results for input(s): "AST", "ALT", "ALKPHOS", "BILITOT", "PROT", "ALBUMIN" in the last 72 hours. No results for input(s): "LIPASE", "AMYLASE" in the last 72 hours. High Sensitivity Troponin:   No results for input(s): "TROPONINIHS" in the last 720 hours.  BNP Invalid input(s): "POCBNP" D-Dimer No results for input(s): "DDIMER" in the last 72 hours. Hemoglobin A1C No results for input(s): "HGBA1C" in the last 72 hours. Fasting Lipid Panel No results for input(s): "CHOL", "HDL", "LDLCALC", "TRIG", "CHOLHDL", "LDLDIRECT" in the last 72 hours. Thyroid Function Tests No results for input(s): "TSH", "T4TOTAL", "T3FREE", "THYROIDAB" in the last 72 hours.  Invalid input(s): "FREET3" _____________  DG Chest 2 View Result Date: 10/13/2023 CLINICAL DATA:  [id number].  Coronary device in-situ. EXAM: CHEST - 2 VIEW COMPARISON:  PA and lateral chest 09/19/2023 FINDINGS: Right chest dual lead pacing system with right atrial and ventricular wire insertions is again  noted. The right chest battery pack is more laterally tilted than on the prior study, but no other noteworthy changes are seen. There is no measurable pneumothorax. The lungs are mildly emphysematous but clear. There is no substantial pleural effusion. The cardiomediastinal silhouette and vascular pattern are normal. Thoracic kyphosis and osteopenia with no new osseous findings. IMPRESSION: 1. The right chest battery pack is more laterally tilted than on the prior study, but no other noteworthy changes are seen. No measurable pneumothorax. 2. Emphysema. No evidence of acute chest disease. Electronically Signed   By: Almira Bar M.D.   On: 10/13/2023 05:54   EP PPM/ICD IMPLANT Result Date: 10/12/2023   CONCLUSIONS:  1. Successful extraction of malfunctioning RV pacing lead.  2. Extraction of RA pacing lead.  3. Successful implantation of new right sided dual chamber pacemaker with LBBAP lead.  4.  No early apparent complications. Nobie Putnam, MD Cardiac Electrophysiology   EP STUDY Result Date: 10/12/2023  CONCLUSIONS:  1. Successful extraction of malfunctioning RV pacing lead.  2. Extraction of RA pacing lead.  3. Successful implantation of new right sided dual chamber pacemaker with LBBAP lead.  4.  No early apparent complications. Nobie Putnam, MD Cardiac Electrophysiology   CT LEAD EXTRACTION W/CONTRAST Result Date: 09/22/2023 CLINICAL DATA:  Cardiovascular Implantable Electronic Device (CIED) lead extraction Planning EXAM: Gated Cardiac CT cardiac TECHNIQUE: A 90 kV prospective scan was performed 50 seconds after contrast injection to optimize venous structures. The field of view included the subclavian veins to the heart. Gantry rotation speed was 250 msecs and collimation was 0.6 mm. The 3D data set was reconstructed in 5% intervals of the 55-75 % of the R-R cycle. Diastolic phases were analyzed on a dedicated work station using MPR, MIP and VRT modes. The patient received 80 cc of contrast. FINDINGS: Image quality: excellent. Noise artifact is: limited. CIED: Course: There is a right side CIED. There are 2 CIED leads present that course trough the right subclavian vein, right brachiocephalic vein, and superior vena cava. There is a single lead that terminates in the right atrium, and a single lead that terminates in the right ventricle. The relationship between CIED leads is further detailed below: Superior Vena Cava: There are 2 CIED that course through the SVC. The CIED leads touch the wall of the SVC for >= 1 cm. Right Atrium: There is a CIED lead that is embedded within the RA appendage. The CIED does not terminate beyond the RA wall. Right Ventricle: There is a CIED lead the is embedded within  the RV apex. The CIED lead terminates <5 mm beyond the RV wall. Lead Fracture: No CIED lead fractures were noted. Thrombus: No CIED lead thrombus was detected. Central Veins: The subclavian vein, brachiocephalic vein, and superior vena cava are patent without stenoses. Right Atrium: Right atrial size is within normal limits. Right Ventricle: The right ventricular cavity is within normal limits. Left Atrium: Left atrial size is normal in size. Left Ventricle: The ventricular cavity size is within normal limits. Pulmonary arteries: Normal in size. Pulmonary veins: Normal pulmonary venous drainage. Pericardium: Normal thickness with no significant effusion or calcium present. Aorta: Normal caliber without significant disease. Extra-cardiac findings: See attached radiology report for non-cardiac structures. IMPRESSION: 1. The CIED leads touch the wall of the SVC for >= 1 cm. 2. A CIED lead is embedded within the RA wall without high-risk features for extraction. 3. A CIED lead is embedded within the RV apex without high-risk features for extraction.  4. No lead fractures or CIED lead thrombus detected. 5. Overall, these findings are moderate risk for CIED lead extraction. Lennie Odor, MD Electronically Signed   By: Lennie Odor M.D.   On: 09/22/2023 21:22   ECHOCARDIOGRAM COMPLETE Result Date: 09/20/2023    ECHOCARDIOGRAM REPORT   Patient Name:   Lori Gay Date of Exam: 09/20/2023 Medical Rec #:  [id number]     Height:       65.0 in Accession #:    [id number]    Weight:       164.2 lb Date of Birth:  07-Jan-1961     BSA:          1.819 m Patient Age:    62 years      BP:           122/80 mmHg Patient Gender: F             HR:           77 bpm. Exam Location:  [address] Procedure: 2D Echo, 3D Echo, Cardiac Doppler, Color Doppler and Strain Analysis            (Both Spectral and Color Flow Doppler were utilized during            procedure). Indications:    I44.2 Complete Heart Block  History:        Patient has  prior history of Echocardiogram examinations, most                 recent 04/29/2018. Pacemaker; Risk Factors:Former Smoker.  Sonographer:    Daphine Deutscher RDCS Referring Phys: [id number] JOSHUA PARKER IMPRESSIONS  1. Left ventricular ejection fraction, by estimation, is 55 to 60%. Left ventricular ejection fraction by 3D volume is 55 %. The left ventricle has normal function. The left ventricle has no regional wall motion abnormalities. Left ventricular diastolic  parameters were normal. The average left ventricular global longitudinal strain is -22.0 %. The global longitudinal strain is normal.  2. Right ventricular systolic function is normal. The right ventricular size is normal. There is normal pulmonary artery systolic pressure. The estimated right ventricular systolic pressure is 25.8 mmHg.  3. The mitral valve is normal in structure. Trivial mitral valve regurgitation. No evidence of mitral stenosis.  4. The aortic valve is normal in structure. Aortic valve regurgitation is not visualized. No aortic stenosis is present.  5. The inferior vena cava is normal in size with greater than 50% respiratory variability, suggesting right atrial pressure of 3 mmHg. FINDINGS  Left Ventricle: Left ventricular ejection fraction, by estimation, is 55 to 60%. Left ventricular ejection fraction by 3D volume is 55 %. The left ventricle has normal function. The left ventricle has no regional wall motion abnormalities. The average left ventricular global longitudinal strain is -22.0 %. Strain was performed and the global longitudinal strain is normal. The left ventricular internal cavity size was normal in size. There is no left ventricular hypertrophy. Left ventricular diastolic parameters were normal. Normal left ventricular filling pressure. Right Ventricle: The right ventricular size is normal. No increase in right ventricular wall thickness. Right ventricular systolic function is normal. There is normal pulmonary  artery systolic pressure. The tricuspid regurgitant velocity is 2.39 m/s, and  with an assumed right atrial pressure of 3 mmHg, the estimated right ventricular systolic pressure is 25.8 mmHg. Left Atrium: Left atrial size was normal in size. Right Atrium: Right atrial size was normal in size. Pericardium: There is no evidence of pericardial  effusion. Mitral Valve: The mitral valve is normal in structure. Trivial mitral valve regurgitation. No evidence of mitral valve stenosis. Tricuspid Valve: The tricuspid valve is normal in structure. Tricuspid valve regurgitation is mild . No evidence of tricuspid stenosis. Aortic Valve: The aortic valve is normal in structure. Aortic valve regurgitation is not visualized. No aortic stenosis is present. Pulmonic Valve: The pulmonic valve was normal in structure. Pulmonic valve regurgitation is not visualized. No evidence of pulmonic stenosis. Aorta: The aortic root is normal in size and structure. Venous: The inferior vena cava is normal in size with greater than 50% respiratory variability, suggesting right atrial pressure of 3 mmHg. IAS/Shunts: No atrial level shunt detected by color flow Doppler. Additional Comments: 3D was performed not requiring image post processing on an independent workstation and was normal. A device lead is visualized.  LEFT VENTRICLE PLAX 2D LVIDd:         3.90 cm         Diastology LVIDs:         2.00 cm         LV e' medial:    9.42 cm/s LV PW:         0.60 cm         LV E/e' medial:  7.3 LV IVS:        0.60 cm         LV e' lateral:   10.36 cm/s LVOT diam:     2.00 cm         LV E/e' lateral: 6.7 LV SV:         62 LV SV Index:   34              2D LVOT Area:     3.14 cm        Longitudinal                                Strain                                2D Strain GLS  -23.4 %                                (A2C):                                2D Strain GLS  -22.0 %                                (A3C):                                2D Strain  GLS  -20.6 %                                (A4C):                                2D Strain GLS  -22.0 %  Avg:                                 3D Volume EF                                LV 3D EF:    Left                                             ventricul                                             ar                                             ejection                                             fraction                                             by 3D                                             volume is                                             55 %.                                 3D Volume EF:                                3D EF:        55 %                                LV EDV:       142 ml                                LV ESV:       64 ml                                LV SV:        77 ml RIGHT VENTRICLE  IVC RV Basal diam:  3.40 cm     IVC diam: 1.70 cm RV S prime:     15.37 cm/s TAPSE (M-mode): 1.8 cm LEFT ATRIUM             Index        RIGHT ATRIUM          Index LA diam:        3.60 cm 1.98 cm/m   RA Area:     9.55 cm LA Vol (A2C):   39.8 ml 21.88 ml/m  RA Volume:   20.50 ml 11.27 ml/m LA Vol (A4C):   31.1 ml 17.10 ml/m LA Biplane Vol: 37.2 ml 20.45 ml/m  AORTIC VALVE LVOT Vmax:   92.87 cm/s LVOT Vmean:  64.367 cm/s LVOT VTI:    0.196 m  AORTA Ao Root diam: 3.40 cm MITRAL VALVE               TRICUSPID VALVE MV Area (PHT): 4.06 cm    TR Peak grad:   22.8 mmHg MV Decel Time: 187 msec    TR Vmax:        239.00 cm/s MV E velocity: 69.00 cm/s MV A velocity: 59.75 cm/s  SHUNTS MV E/A ratio:  1.15        Systemic VTI:  0.20 m                            Systemic Diam: 2.00 cm Armanda Magic MD Electronically signed by Armanda Magic MD Signature Date/Time: 09/20/2023/4:49:56 PM    Final    Disposition   Pt is being discharged home today in good condition.  Follow-up Plans & Appointments     Discharge Instructions     Call MD for:  redness,  tenderness, or signs of infection (pain, swelling, redness, odor or green/yellow discharge around incision site)   Complete by: As directed    Call MD for:  temperature >100.4   Complete by: As directed    Diet - low sodium heart healthy   Complete by: As directed    Increase activity slowly   Complete by: As directed         Discharge Medications   Allergies as of 10/13/2023       Reactions   Loratadine Other (See Comments)   CLARITIN D -  Weak   Penicillins Other (See Comments)   DID THE REACTION INVOLVE: Swelling of the face/tongue/throat, SOB, or low BP? Unknown Sudden or severe rash/hives, skin peeling, or the inside of the mouth or nose? Unknown Did it require medical treatment? Unknown When did it last happen?    When she was young   If all above answers are "NO", may proceed with cephalosporin use.        Medication List     TAKE these medications    acetaminophen 325 MG tablet Commonly known as: TYLENOL Take 1-2 tablets (325-650 mg total) by mouth every 4 (four) hours as needed for mild pain (pain score 1-3).   celecoxib 100 MG capsule Commonly known as: CELEBREX Take 100 mg by mouth 2 (two) times daily.   cetirizine 10 MG tablet Commonly known as: ZYRTEC Take 10 mg by mouth daily.   clonazePAM 1 MG tablet Commonly known as: KLONOPIN Take 1 tablet by mouth at bedtime as needed for anxiety.   gabapentin 300 MG capsule Commonly known as: NEURONTIN Take 300 mg by mouth 3 (three) times daily.  KRILL OIL PO Take 1,200 mg by mouth daily.   rosuvastatin 5 MG tablet Commonly known as: CRESTOR Take 1 tablet (5 mg total) by mouth daily.           Outstanding Labs/Studies   none Duration of Discharge Encounter: APP Time: 32 minutes   Signed, Theodore Demark, PA-C 10/13/2023, 8:00 AM

## 2023-10-13 NOTE — Plan of Care (Signed)

## 2023-10-14 ENCOUNTER — Encounter (HOSPITAL_COMMUNITY): Payer: Self-pay | Admitting: Internal Medicine

## 2023-10-14 LAB — BPAM RBC
Blood Product Expiration Date: 202504052359
Blood Product Expiration Date: 202504052359
ISSUE DATE / TIME: 202503071110
ISSUE DATE / TIME: 202503071110
Unit Type and Rh: 5100
Unit Type and Rh: 5100

## 2023-10-14 LAB — TYPE AND SCREEN
ABO/RH(D): O POS
Antibody Screen: NEGATIVE
Unit division: 0
Unit division: 0

## 2023-10-15 ENCOUNTER — Encounter (HOSPITAL_COMMUNITY): Payer: Self-pay | Admitting: Internal Medicine

## 2023-10-15 NOTE — Anesthesia Postprocedure Evaluation (Signed)
 Anesthesia Post Note  Patient: Lori Gay  Procedure(s) Performed: LEAD EXTRACTION PPM GENERATOR CHANGEOUT TRANSESOPHAGEAL ECHOCARDIOGRAM     Patient location during evaluation: PACU Anesthesia Type: General Level of consciousness: awake and alert Pain management: pain level controlled Vital Signs Assessment: post-procedure vital signs reviewed and stable Respiratory status: spontaneous breathing, nonlabored ventilation, respiratory function stable and patient connected to nasal cannula oxygen Cardiovascular status: blood pressure returned to baseline and stable Postop Assessment: no apparent nausea or vomiting Anesthetic complications: no   No notable events documented.  Last Vitals:  Vitals:   10/13/23 0609 10/13/23 0835  BP: (!) 104/59 105/74  Pulse: 71 70  Resp: 19 18  Temp: 36.4 C 36.4 C  SpO2: 97% 98%    Last Pain:  Vitals:   10/13/23 1043  TempSrc:   PainSc: 0-No pain                 Densil Ottey S

## 2023-10-25 ENCOUNTER — Ambulatory Visit
Admission: RE | Admit: 2023-10-25 | Discharge: 2023-10-25 | Disposition: A | Discharge status: Home / Self Care | Source: Ambulatory Visit | Attending: Cardiology | Admitting: Cardiology

## 2023-10-25 ENCOUNTER — Ambulatory Visit: Attending: Cardiology

## 2023-10-25 DIAGNOSIS — I442 Atrioventricular block, complete: Secondary | ICD-10-CM

## 2023-10-25 NOTE — Patient Instructions (Addendum)

## 2023-10-26 ENCOUNTER — Encounter: Payer: Self-pay | Admitting: Cardiology

## 2023-10-26 ENCOUNTER — Encounter

## 2023-10-26 ENCOUNTER — Ambulatory Visit: Attending: Cardiovascular Disease

## 2023-10-26 DIAGNOSIS — I441 Atrioventricular block, second degree: Secondary | ICD-10-CM

## 2023-10-26 LAB — CUP PACEART INCLINIC DEVICE CHECK
Date Time Interrogation Session: 20250321165241
Implantable Lead Connection Status: 753985
Implantable Lead Connection Status: 753985
Implantable Lead Implant Date: 20250307
Implantable Lead Implant Date: 20250307
Implantable Lead Location: 753859
Implantable Lead Location: 753860
Implantable Lead Model: 3830
Implantable Lead Model: 5076
Implantable Pulse Generator Implant Date: 20250307

## 2023-10-26 NOTE — Progress Notes (Signed)
 Pt brought in for TWOS on RA. Bipolar p waves measures 0.16mV. FF sensing noted real time and appropriately events were blanked; however, there were two logged episodes of false mode switches since yesterday. Unipolar p waves measured 2.44mV. Isometrics performed and no oversensing noted.   Changes made this session and as follows.  - Sensing changed to unipolar - Atrial sensitivity changed to 0.93mV.

## 2023-10-29 LAB — CUP PACEART INCLINIC DEVICE CHECK
Battery Remaining Longevity: 140 mo
Battery Voltage: 3.21 V
Brady Statistic AP VP Percent: 8.12 %
Brady Statistic AP VS Percent: 0.06 %
Brady Statistic AS VP Percent: 91.26 %
Brady Statistic AS VS Percent: 0.56 %
Brady Statistic RA Percent Paced: 8.82 %
Brady Statistic RV Percent Paced: 99.33 %
Date Time Interrogation Session: 20250320142600
Implantable Lead Connection Status: 753985
Implantable Lead Connection Status: 753985
Implantable Lead Implant Date: 20250307
Implantable Lead Implant Date: 20250307
Implantable Lead Location: 753859
Implantable Lead Location: 753860
Implantable Lead Model: 3830
Implantable Lead Model: 5076
Implantable Pulse Generator Implant Date: 20250307
Lead Channel Impedance Value: 380 Ohm
Lead Channel Impedance Value: 380 Ohm
Lead Channel Impedance Value: 418 Ohm
Lead Channel Impedance Value: 589 Ohm
Lead Channel Pacing Threshold Amplitude: 0.375 V
Lead Channel Pacing Threshold Amplitude: 0.875 V
Lead Channel Pacing Threshold Pulse Width: 0.4 ms
Lead Channel Pacing Threshold Pulse Width: 0.4 ms
Lead Channel Sensing Intrinsic Amplitude: 0.5 mV
Lead Channel Sensing Intrinsic Amplitude: 1.375 mV
Lead Channel Sensing Intrinsic Amplitude: 8.75 mV
Lead Channel Sensing Intrinsic Amplitude: 8.75 mV
Lead Channel Setting Pacing Amplitude: 3.5 V
Lead Channel Setting Pacing Amplitude: 3.5 V
Lead Channel Setting Pacing Pulse Width: 0.4 ms
Lead Channel Setting Sensing Sensitivity: 1.2 mV
Zone Setting Status: 755011

## 2023-10-29 NOTE — Progress Notes (Signed)
 Normal dual chamber pacemaker wound check. Presenting rhythm: AS/VP 80 . Wound well healed. Routine testing performed. Thresholds and impedances consistent with implant measurements and at 3.5V safety margin/auto capture until 3 month visit.  Atrial sensing measured at 0.5 mV.  [place] noted on atrial lead marked as AB.  Atrial sensing programmed at 0.3 mV.  AB markers show a sharp deflection which is concerning for potential movement of newly implanted atrial lead.  Reviewed with Dr. Lalla Brothers.  Will send Pt for chest xray to evaluate atrial lead placement. Reviewed arm restrictions to continue for 6 weeks total post op.  Pt enrolled in remote follow-up.  Addendum:  Chest xray reviewed with Dr. Lalla Brothers.  Atrial lead appears stable.  Will have Pt come to device clinic 10/26/2023 with industry for potential reprogramming.

## 2023-11-27 ENCOUNTER — Telehealth: Payer: Self-pay

## 2023-11-27 NOTE — Telephone Encounter (Signed)
 Alert received from [employer]:  Alert remote transmission:  High RV thresholds measured for 3 consecutive days. RV threshold with upward trend - route to triage  Outreach made to Pt.  Advised would like to have Pt come for appointment to test device.  Pt scheduled with AT 11/29/2023 at 9:00 am.

## 2023-11-29 ENCOUNTER — Encounter: Payer: Self-pay | Admitting: Student

## 2023-11-29 ENCOUNTER — Ambulatory Visit: Attending: Student | Admitting: Student

## 2023-11-29 VITALS — BP 122/80 | HR 64 | Ht 64.0 in | Wt 162.0 lb

## 2023-11-29 DIAGNOSIS — I442 Atrioventricular block, complete: Secondary | ICD-10-CM

## 2023-11-29 DIAGNOSIS — T82110D Breakdown (mechanical) of cardiac electrode, subsequent encounter: Secondary | ICD-10-CM

## 2023-11-29 NOTE — Progress Notes (Signed)
  Electrophysiology Office Note:   ID:  Lori Gay, DOB 1960-11-08, MRN [id number]  Primary Cardiologist: None Electrophysiologist: Will Cortland Ding, MD      History of Present Illness:   Lori Gay is a 63 y.o. female with h/o AVNRT s/p ablation, Second degree AV block s/p PPM, and h/o RV lead malfunction seen today for acute visit due to RV threshold elevated on her new device.     Patient reports doing well, though felt like she may have a retained suture. Otherwise, she denies chest pain, palpitations, dyspnea, PND, orthopnea, nausea, vomiting, dizziness, syncope, edema, weight gain, or early satiety.   Review of systems complete and found to be negative unless listed in HPI.   EP Information / Studies Reviewed:    EKG is not ordered today. EKG from 3/8/202 reviewed which showed AS-VP rhythm       PPM Interrogation-  reviewed in detail today,  See PACEART report.  Arrhythmia/Device History MDT Dual PPM initially implanted 2011, gen change 2020 -> with gradual RV lead failure. RA lead found to be bound to RV lead at time of extraction and could not be spared  Extraction and new MDT Dual Chamber (LBBA) PPM placed 10/12/2023     Physical Exam:   VS:  BP 122/80 (BP Location: Left Arm, Patient Position: Sitting, Cuff Size: Normal)   Pulse 64   Ht 5\' 4"  (1.626 m)   Wt 162 lb (73.5 kg)   SpO2 99%   BMI 27.81 kg/m    Wt Readings from Last 3 Encounters:  11/29/23 162 lb (73.5 kg)  10/12/23 162 lb (73.5 kg)  09/06/23 164 lb 3.2 oz (74.5 kg)     GEN: No acute distress  NECK: No JVD; No carotid bruits CARDIAC: Regular rate and rhythm, no murmurs, rubs, gallops RESPIRATORY:  Clear to auscultation without rales, wheezing or rhonchi  ABDOMEN: Soft, non-tender, non-distended EXTREMITIES:  No edema; No deformity   ASSESSMENT AND PLAN:    CHB s/p Medtronic PPM  Prior RV lead malfunction Increasing RV threshold Normal PPM function See Pace Art report No changes  today  Small retained suture noted approximately 3/4 way down from the top of her relatively vertical suture. Attempted to remove with tweezers, but only able to remove a small portion. Encouraged pt that it will either finish resorbing now, or if more is pushed out it can be removed by us  at that time.   Underlying rhythm NSR with CHB, escape in the 30s Presented with high output mode (5.0V @ 1.0 ms) Estimated battery life: 3.9 years  Testing as below:  RV threshold Bipolar 2.25V @ 0.49ms 1.75V @ 0.8 ms 1.75V @ 1.0 ms  Unipolar 1.5V @ 0.27ms , stable impedance 1.25V @ 1.69ms, stable impedance  Discussed with Dr. Carolynne Citron in office.  Pt programmed UNIPOLAR on RV with a 1V safety margin at 2.5V @ 0.76ms. She will keep close follow up 6/9 for further evaluation.  New estimated battery life: 8.9 years  Pt understands she may require further revision if threshold continues to worsen or if lead fails. Alarm symptoms reviewed.   Disposition:   Follow up with EP APP as scheduled.   Signed, Tylene Galla, PA-C

## 2023-11-29 NOTE — Patient Instructions (Signed)
 Medication Instructions:  Your physician recommends that you continue on your current medications as directed. Please refer to the Current Medication list given to you today.  *If you need a refill on your cardiac medications before your next appointment, please call your pharmacy*  Lab Work: None ordered If you have labs (blood work) drawn today and your tests are completely normal, you will receive your results only by: MyChart Message (if you have MyChart) OR A paper copy in the mail If you have any lab test that is abnormal or we need to change your treatment, we will call you to review the results.  Follow-Up: At [facility], you and your health needs are our priority.  As part of our continuing mission to provide you with exceptional heart care, our providers are all part of one team.  This team includes your primary Cardiologist (physician) and Advanced Practice Providers or APPs (Physician Assistants and Nurse Practitioners) who all work together to provide you with the care you need, when you need it.  Your next appointment:   As scheduled    1st Floor: - Lobby - Registration  - Pharmacy  - Lab - Cafe  2nd Floor: - PV Lab - Diagnostic Testing (echo, CT, nuclear med)  3rd Floor: - Vacant  4th Floor: - TCTS (cardiothoracic surgery) - AFib Clinic - Structural Heart Clinic - Vascular Surgery  - Vascular Ultrasound  5th Floor: - HeartCare Cardiology (general and EP) - Clinical Pharmacy for coumadin, hypertension, lipid, weight-loss medications, and med management appointments    Valet parking services will be available as well.

## 2023-11-30 NOTE — Telephone Encounter (Signed)
 Instruction letter has been discarded since patient hasn't picked it from our office.

## 2023-12-06 ENCOUNTER — Other Ambulatory Visit: Payer: Self-pay

## 2023-12-06 ENCOUNTER — Telehealth: Payer: Self-pay

## 2023-12-06 ENCOUNTER — Ambulatory Visit: Attending: Cardiology

## 2023-12-06 DIAGNOSIS — I441 Atrioventricular block, second degree: Secondary | ICD-10-CM

## 2023-12-06 DIAGNOSIS — T82110A Breakdown (mechanical) of cardiac electrode, initial encounter: Secondary | ICD-10-CM

## 2023-12-06 LAB — CBC

## 2023-12-06 NOTE — Telephone Encounter (Signed)
 Spoke to patient. Device clinic apt made today 12/06/23 @ 3:20. Patient updated on new location.

## 2023-12-06 NOTE — Patient Instructions (Addendum)
See instruction letter 

## 2023-12-06 NOTE — Progress Notes (Signed)
 Pt seen in clinic as add on for increased RV threshold.  Pt seen last week by Michaelle Adolphus PA and RV threshold programmed unipolar at 2.5V at 0.105ms.  Per Pt-she has had intermittent dizziness seen last office visit.  Pt RV lead tested VVI today with threshold 2.25V at 0.58ms.  Discussed with AT/JP.  Pt scheduled for venogram with potential lead revision 12/07/2023.  Pt programmed 3.5V at 0.32ms.

## 2023-12-06 NOTE — Telephone Encounter (Signed)
 Alert received from [employer] for High RV thresholds measured for 3 consecutive days.  Stable impedance. Pt was seen in office 4/24 to evaluate lead, programmed unipolar.  Routing to [place] who saw pt in office recently and additional testing was completed. Please advise further.

## 2023-12-07 ENCOUNTER — Ambulatory Visit (HOSPITAL_COMMUNITY)
Admission: RE | Admit: 2023-12-07 | Discharge: 2023-12-08 | Disposition: A | Discharge status: Home / Self Care | Attending: Cardiology | Admitting: Cardiology

## 2023-12-07 ENCOUNTER — Encounter (HOSPITAL_COMMUNITY): Payer: Self-pay | Admitting: Cardiology

## 2023-12-07 ENCOUNTER — Encounter (HOSPITAL_COMMUNITY)
Admission: RE | Disposition: A | Discharge status: Home / Self Care | Payer: Self-pay | Source: Home / Self Care | Attending: Cardiology

## 2023-12-07 ENCOUNTER — Encounter: Payer: Self-pay | Admitting: Emergency Medicine

## 2023-12-07 ENCOUNTER — Other Ambulatory Visit: Payer: Self-pay

## 2023-12-07 DIAGNOSIS — Z950 Presence of cardiac pacemaker: Secondary | ICD-10-CM | POA: Insufficient documentation

## 2023-12-07 DIAGNOSIS — T82110A Breakdown (mechanical) of cardiac electrode, initial encounter: Secondary | ICD-10-CM | POA: Diagnosis present

## 2023-12-07 DIAGNOSIS — Y831 Surgical operation with implant of artificial internal device as the cause of abnormal reaction of the patient, or of later complication, without mention of misadventure at the time of the procedure: Secondary | ICD-10-CM | POA: Insufficient documentation

## 2023-12-07 DIAGNOSIS — I442 Atrioventricular block, complete: Secondary | ICD-10-CM | POA: Diagnosis not present

## 2023-12-07 HISTORY — PX: UPPER EXTREMITY VENOGRAPHY: CATH118272

## 2023-12-07 HISTORY — PX: LEAD REVISION/REPAIR: EP1213

## 2023-12-07 LAB — CBC
Hematocrit: 39.2 % (ref 34.0–46.6)
Hemoglobin: 13.6 g/dL (ref 11.1–15.9)
MCH: 31.2 pg (ref 26.6–33.0)
MCHC: 34.7 g/dL (ref 31.5–35.7)
MCV: 90 fL (ref 79–97)
Platelets: 181 10*3/uL (ref 150–450)
RBC: 4.36 x10E6/uL (ref 3.77–5.28)
RDW: 11.9 % (ref 11.7–15.4)
WBC: 5.6 10*3/uL (ref 3.4–10.8)

## 2023-12-07 LAB — BASIC METABOLIC PANEL WITH GFR
BUN/Creatinine Ratio: 19 (ref 12–28)
BUN: 15 mg/dL (ref 8–27)
CO2: 23 mmol/L (ref 20–29)
Calcium: 8.9 mg/dL (ref 8.7–10.3)
Chloride: 104 mmol/L (ref 96–106)
Creatinine, Ser: 0.8 mg/dL (ref 0.57–1.00)
Glucose: 92 mg/dL (ref 70–99)
Potassium: 3.8 mmol/L (ref 3.5–5.2)
Sodium: 143 mmol/L (ref 134–144)
eGFR: 84 mL/min/{1.73_m2} (ref >59)

## 2023-12-07 SURGERY — LEAD REVISION/REPAIR
Anesthesia: LOCAL

## 2023-12-07 MED ORDER — CHLORHEXIDINE GLUCONATE 4 % EX SOLN
4.0000 | Freq: Once | CUTANEOUS | Status: DC
  Filled 2023-12-07: qty 60

## 2023-12-07 MED ORDER — FENTANYL CITRATE (PF) 100 MCG/2ML IJ SOLN
INTRAMUSCULAR | Status: AC
  Filled 2023-12-07: qty 2

## 2023-12-07 MED ORDER — SODIUM CHLORIDE 0.9 % IV SOLN
80.0000 mg | INTRAVENOUS | Status: AC
  Administered 2023-12-07: 80 mg

## 2023-12-07 MED ORDER — GABAPENTIN 300 MG PO CAPS
300.0000 mg | ORAL_CAPSULE | Freq: Three times a day (TID) | ORAL | Status: DC
  Administered 2023-12-07: 300 mg via ORAL
  Filled 2023-12-07 (×2): qty 1

## 2023-12-07 MED ORDER — TRAMADOL HCL 50 MG PO TABS
50.0000 mg | ORAL_TABLET | Freq: Two times a day (BID) | ORAL | Status: DC | PRN
  Administered 2023-12-07: 50 mg via ORAL
  Filled 2023-12-07: qty 1

## 2023-12-07 MED ORDER — CLONAZEPAM 1 MG PO TABS
1.0000 mg | ORAL_TABLET | Freq: Every evening | ORAL | Status: DC | PRN
  Administered 2023-12-07: 1 mg via ORAL
  Filled 2023-12-07: qty 2

## 2023-12-07 MED ORDER — GENTAMICIN SULFATE 40 MG/ML IJ SOLN
INTRAMUSCULAR | Status: AC
  Filled 2023-12-07: qty 2

## 2023-12-07 MED ORDER — CEFAZOLIN SODIUM-DEXTROSE 2-4 GM/100ML-% IV SOLN
2.0000 g | INTRAVENOUS | Status: AC
  Administered 2023-12-07: 2 g via INTRAVENOUS

## 2023-12-07 MED ORDER — SODIUM CHLORIDE 0.9 % IV SOLN
INTRAVENOUS | Status: DC
Start: 2023-12-07 — End: 2023-12-07

## 2023-12-07 MED ORDER — FENTANYL CITRATE (PF) 100 MCG/2ML IJ SOLN
INTRAMUSCULAR | Status: DC | PRN
  Administered 2023-12-07 (×2): 25 ug via INTRAVENOUS
  Administered 2023-12-07 (×2): 50 ug via INTRAVENOUS

## 2023-12-07 MED ORDER — ACETAMINOPHEN 325 MG PO TABS
325.0000 mg | ORAL_TABLET | ORAL | Status: DC | PRN
  Administered 2023-12-08: 650 mg via ORAL
  Filled 2023-12-07: qty 2

## 2023-12-07 MED ORDER — CEFAZOLIN SODIUM-DEXTROSE 2-4 GM/100ML-% IV SOLN
INTRAVENOUS | Status: AC
Start: 2023-12-07 — End: 2023-12-08
  Filled 2023-12-07: qty 100

## 2023-12-07 MED ORDER — CEFAZOLIN SODIUM-DEXTROSE 1-4 GM/50ML-% IV SOLN
1.0000 g | Freq: Four times a day (QID) | INTRAVENOUS | Status: AC
  Administered 2023-12-07 – 2023-12-08 (×3): 1 g via INTRAVENOUS
  Filled 2023-12-07 (×3): qty 50

## 2023-12-07 MED ORDER — MIDAZOLAM HCL 2 MG/2ML IJ SOLN
INTRAMUSCULAR | Status: AC
  Filled 2023-12-07: qty 2

## 2023-12-07 MED ORDER — LIDOCAINE HCL (PF) 1 % IJ SOLN
INTRAMUSCULAR | Status: DC | PRN
  Administered 2023-12-07: 60 mL

## 2023-12-07 MED ORDER — LIDOCAINE HCL (PF) 1 % IJ SOLN
INTRAMUSCULAR | Status: AC
  Filled 2023-12-07: qty 60

## 2023-12-07 MED ORDER — MIDAZOLAM HCL 5 MG/5ML IJ SOLN
INTRAMUSCULAR | Status: DC | PRN
  Administered 2023-12-07 (×2): 1 mg via INTRAVENOUS
  Administered 2023-12-07 (×2): .5 mg via INTRAVENOUS

## 2023-12-07 MED ORDER — POVIDONE-IODINE 10 % EX SWAB
2.0000 | Freq: Once | CUTANEOUS | Status: AC
  Administered 2023-12-07: 2 via TOPICAL

## 2023-12-07 MED ORDER — ROSUVASTATIN CALCIUM 5 MG PO TABS
5.0000 mg | ORAL_TABLET | Freq: Every day | ORAL | Status: DC
  Administered 2023-12-07: 5 mg via ORAL
  Filled 2023-12-07 (×2): qty 1

## 2023-12-07 MED ORDER — ONDANSETRON HCL 4 MG/2ML IJ SOLN
4.0000 mg | Freq: Four times a day (QID) | INTRAMUSCULAR | Status: DC | PRN
  Administered 2023-12-07 – 2023-12-08 (×2): 4 mg via INTRAVENOUS
  Filled 2023-12-07 (×2): qty 2

## 2023-12-07 SURGICAL SUPPLY — 15 items
ADAPTER SEALING SSSA-09 (ADAPTER) IMPLANT
CABLE SURGICAL S-101-97-12 (CABLE) ×3 IMPLANT
CATH HIS SELECTSITE C304HIS (CATHETERS) IMPLANT
KIT MICROPUNCTURE NIT STIFF (SHEATH) IMPLANT
KIT WRENCH (KITS) IMPLANT
LEAD CAPSURE NOVUS 5076-58CM (Lead) IMPLANT
LEAD SELECT SECURE 3830 383069 (Lead) IMPLANT
PAD DEFIB RADIO PHYSIO CONN (PAD) ×3 IMPLANT
POUCH AIGIS-R ANTIBACT PPM (Mesh General) ×2 IMPLANT
POUCH AIGIS-R ANTIBACT PPM MED (Mesh General) IMPLANT
SHEATH 9FR PRELUDE SNAP 13 (SHEATH) IMPLANT
SLITTER 6232ADJ (MISCELLANEOUS) IMPLANT
SYR CONTROL 10ML ANGIOGRAPHIC (SYRINGE) IMPLANT
TRAY PACEMAKER INSERTION (PACKS) ×3 IMPLANT
WIRE HI TORQ VERSACORE-J 145CM (WIRE) IMPLANT

## 2023-12-07 NOTE — H&P (Signed)
   Electrophysiology Note:   ID:  Lori Gay, DOB 1961/06/19, MRN [id number]   Primary Cardiologist: None Electrophysiologist: Will Cortland Ding, MD       History of Present Illness:   Lori Gay is a 63 y.o. female with h/o AVNRT s/p ablation, second degree AV block s/p PPM c/b RV lead malfunction s/p extraction and reimplant on 10/12/23 who has now developed increasing RV capture threshold on her new lead. She reports feeling relatively well. She has no new or acute complaints today.       Review of systems complete and found to be negative unless listed in HPI.     Physical Exam:    Vitals:   12/07/23 1322  BP: 118/80  Pulse: 71  Resp: 17  Temp: 98.1 F (36.7 C)  SpO2: 97%   General: Well developed, in no acute distress.  Neck: No JVD.  Cardiac: Normal rate, regular rhythm.  Resp: Normal work of breathing.  Ext: No edema.  Neuro: No gross focal deficits.  Psych: Normal affect.   ASSESSMENT AND PLAN:    Patient is dependent on RV pacing. Now with increasing RV capture threshold. She presents today for venogram and RV lead revision.    CHB s/p Medtronic PPM  Prior RV lead malfunction Increasing RV threshold -Explained risks, benefits, and alternatives to RV lead revision, including but not limited to bleeding, infection, damage to heart or lungs, heart attack, stroke, or death.  Pt verbalized understanding and agrees to proceed.  Lori Kohler, MD, [facility], [facility] Cardiac Electrophysiology

## 2023-12-07 NOTE — Discharge Instructions (Signed)
 After Your Lead Revision  Do not lift your arm above shoulder height for 1 week after your procedure. After 7 days, you may progress as below.  You should remove your sling 24 hours after your procedure, unless otherwise instructed by your provider.     Dec 22, 2023   Dec 29, 2023   Dec 30, 2023   Dec 31, 2023   Do not lift, push, pull, or carry anything over 10 pounds with the affected arm until 6 weeks (Friday January 18, 2024) after your procedure.   Do not drive until your wound check or until instructed by your healthcare provider that you are safe to do so.   Monitor your surgical site for redness, swelling, and drainage. Call the device clinic at [phone] if you experience these symptoms or fever/chills.  If your incision is sealed with Steri-strips or staples. You may shower 7 days after your procedure and wash your incision with soap and water as long as it is healed. If your incision is closed with Dermabond/Surgical glue. You may shower 1 day after your pacemaker implant and wash your incision with soap and water. Avoid lotions, ointments, or perfumes over your incision until it is well-healed.  You may use a hot tub or a pool AFTER your wound check appointment if the incision is completely closed.   Your cardiac device may be MRI compatible. We will discuss this at your office visit/Wound check  Remote monitoring is used to monitor your cardiac device from home. This monitoring is scheduled every 91 days by our office. It allows us  to keep an eye on the functioning of your device to ensure it is working properly. You will routinely see your Electrophysiologist annually (more often if necessary).   Implantable Cardiac Device Lead Replacement, Care After This sheet gives you information about how to care for yourself after your procedure. Your health care provider may also give you more specific instructions. If you have problems or questions, contact your health care  provider. What can I expect after the procedure? After your procedure, it is common to have: Mild discomfort at the incision site. A small amount of drainage or bleeding at the incision site. This is usually no more than a spot. Follow these instructions at home: Incision care        Follow instructions from your health care provider about how to take care of your incision. Make sure you: Leave stitches (sutures), skin glue, or adhesive strips in place. These skin closures may need to stay in place for 2 weeks or longer. If adhesive strip edges start to loosen and curl up, you may trim the loose edges. Do not remove adhesive strips completely unless your health care provider tells you to do that. Check your incision area every day for signs of infection. Check for: More redness, swelling, or pain. More fluid or blood. Warmth. Pus or a bad smell. Electric and [employer], building services cell phones should be kept 12 inches (30 cm) away from the cardiac device when they are on. When talking on a cell phone, use the ear on the opposite side of your cardiac device. Do not place a cell phone in a pocket next to the cardiac device. Household appliances do not interfere with modern-day cardiac device. Medicines Take over-the-counter and prescription medicines only as told by your health care provider. General instructions Do not raise the arm on the side of your procedure higher than your shoulder for at least 7 days.  Except for this restriction, continue to use your arm as normal to prevent problems. Do not take baths, swim, or use a hot tub until your health care provider says it is okay to do so. You may shower as directed by your health care provider. Do not lift anything that is heavier than 10 lb (4.5 kg) for 6 weeks or the limit that your health care provider tells you, until he or she says that it is safe. Return to your normal activities after 2 weeks, or as told by your health care  provider. Ask your health care provider what activities are safe for you. Keep all follow-up visits as told by your health care provider. This is important. Contact a health care provider if: You have more redness, swelling, or pain around your incision. You have more fluid or blood coming from your incision. Your incision feels warm to the touch. You have pus or a bad smell coming from your incision. You have a fever. The arm or hand on the side of the cardiac device becomes swollen. The symptoms you had before your procedure are not getting better. Get help right away if: You develop chest pain. You feel like you will faint. You feel light-headed. You faint. Summary Check your incision area every day for signs of infection, such as more fluid or blood. A small amount of drainage or bleeding at the incision site is normal. Do not raise the arm on the side of your procedure higher than your shoulder for at least 5 days, or as long as directed by your health care provider. Digital cell phones should be kept 12 inches (30 cm) away from the cardiac device when they are on. When talking on a cell phone, use the ear on the opposite side of your cardiac device. If the symptoms that led to having your lead replaced are not getting better, contact your health care provider. This information is not intended to replace advice given to you by your health care provider. Make sure you discuss any questions you have with your health care provider.

## 2023-12-08 ENCOUNTER — Encounter (HOSPITAL_COMMUNITY): Payer: Self-pay | Admitting: Cardiology

## 2023-12-08 ENCOUNTER — Ambulatory Visit (HOSPITAL_COMMUNITY)

## 2023-12-08 DIAGNOSIS — Z950 Presence of cardiac pacemaker: Secondary | ICD-10-CM | POA: Diagnosis not present

## 2023-12-08 DIAGNOSIS — T82110A Breakdown (mechanical) of cardiac electrode, initial encounter: Secondary | ICD-10-CM | POA: Diagnosis not present

## 2023-12-08 DIAGNOSIS — I442 Atrioventricular block, complete: Secondary | ICD-10-CM | POA: Diagnosis not present

## 2023-12-08 MED ORDER — ACETAMINOPHEN 500 MG PO TABS: 1000.0000 mg | ORAL_TABLET | Freq: Four times a day (QID) | ORAL | Status: DC | PRN

## 2023-12-08 NOTE — Progress Notes (Signed)
   Patient Name: Maryl Bramlet Date of Encounter: 12/08/2023 [facility] Cardiologist: None   Interval Summary  .    RV lead revision yesterday. No acute overnight events. Some nausea after receiving pain medication last night. Otherwise doing well. Still some right chest soreness at incision. No new or acute complaints otherwise.   Vital Signs .    Vitals:   12/07/23 2019 12/08/23 0013 12/08/23 0525 12/08/23 0801  BP:  104/67 110/64 117/69  Pulse:  73 79 61  Resp: 20 18 17 18   Temp: 97.9 F (36.6 C) 97.8 F (36.6 C) 98.2 F (36.8 C) 98.3 F (36.8 C)  TempSrc: Oral Oral Oral Oral  SpO2:  97% 99% 99%  Weight:   72.3 kg   Height:        Intake/Output Summary (Last 24 hours) at 12/08/2023 1123 Last data filed at 12/08/2023 0525 Gross per 24 hour  Intake --  Output 500 ml  Net -500 ml      12/08/2023    5:25 AM 12/07/2023    8:00 PM 12/07/2023    1:22 PM  Last 3 Weights  Weight (lbs) 159 lb 6.3 oz 159 lb 6.3 oz 162 lb  Weight (kg) 72.3 kg 72.3 kg 73.483 kg      Telemetry/ECG    AS/VP - Personally Reviewed  Physical Exam .   GEN: No acute distress.   Neck: No JVD Cardiac: Normal rate. Right chest pacer pocket without hematoma or bleeding. Respiratory: Clear to auscultation bilaterally. GI: Soft, nontender, non-distended  MS: No edema  Assessment & Plan .     Jerzey Dechene is a 63 y.o. female with h/o AVNRT s/p ablation, second degree AV block s/p PPM c/b RV lead malfunction s/p extraction and reimplant on 10/12/23 who has now developed increasing RV capture threshold on her new lead.   S/p removal of old RV lead and reimplantation of new 5076 pacing lead in the RV apex on 12/07/23.   #. RV lead malfunction #. CHB #. Dual chamber pacemaker  Plan:  -CXR with stable lead position and no pneumothorax.  -Bedside device check with appropriate function and stable lead parameters.  -Usual post-op instructions regarding activity restrictions and wound care provided.    For questions or updates, please contact [facility] Please consult [email or web address] for contact info under        Signed, Ardeen Kohler, MD

## 2023-12-08 NOTE — H&P (View-Only) (Signed)
   Patient Name: Lori Gay Date of Encounter: 12/08/2023 [facility] Cardiologist: None   Interval Summary  .    RV lead revision yesterday. No acute overnight events. Some nausea after receiving pain medication last night. Otherwise doing well. Still some right chest soreness at incision. No new or acute complaints otherwise.   Vital Signs .    Vitals:   12/07/23 2019 12/08/23 0013 12/08/23 0525 12/08/23 0801  BP:  104/67 110/64 117/69  Pulse:  73 79 61  Resp: 20 18 17 18   Temp: 97.9 F (36.6 C) 97.8 F (36.6 C) 98.2 F (36.8 C) 98.3 F (36.8 C)  TempSrc: Oral Oral Oral Oral  SpO2:  97% 99% 99%  Weight:   72.3 kg   Height:        Intake/Output Summary (Last 24 hours) at 12/08/2023 1123 Last data filed at 12/08/2023 0525 Gross per 24 hour  Intake --  Output 500 ml  Net -500 ml      12/08/2023    5:25 AM 12/07/2023    8:00 PM 12/07/2023    1:22 PM  Last 3 Weights  Weight (lbs) 159 lb 6.3 oz 159 lb 6.3 oz 162 lb  Weight (kg) 72.3 kg 72.3 kg 73.483 kg      Telemetry/ECG    AS/VP - Personally Reviewed  Physical Exam .   GEN: No acute distress.   Neck: No JVD Cardiac: Normal rate. Right chest pacer pocket without hematoma or bleeding. Respiratory: Clear to auscultation bilaterally. GI: Soft, nontender, non-distended  MS: No edema  Assessment & Plan .     Lori Gay is a 63 y.o. female with h/o AVNRT s/p ablation, second degree AV block s/p PPM c/b RV lead malfunction s/p extraction and reimplant on 10/12/23 who has now developed increasing RV capture threshold on her new lead.   S/p removal of old RV lead and reimplantation of new 5076 pacing lead in the RV apex on 12/07/23.   #. RV lead malfunction #. CHB #. Dual chamber pacemaker  Plan:  -CXR with stable lead position and no pneumothorax.  -Bedside device check with appropriate function and stable lead parameters.  -Usual post-op instructions regarding activity restrictions and wound care provided.    For questions or updates, please contact [facility] Please consult [email or web address] for contact info under        Signed, Ardeen Kohler, MD

## 2023-12-08 NOTE — Discharge Summary (Addendum)
 ELECTROPHYSIOLOGY PROCEDURE DISCHARGE SUMMARY    Patient ID: Lori Gay,  MRN: [id number], DOB/AGE: 62-Jan-1962 62 y.o.  Admit date: 12/07/2023 Discharge date: 12/08/2023  Primary Care Physician: Spero Dye, PA-C  Primary Cardiologist: None Primary EP: Dr. Daneil Dunker   Primary Discharge Diagnosis:  Principal Problem:   CHB (complete heart block) (HCC)    Secondary Discharge Diagnosis AVNRT s/p ablation  Allergies  Allergen Reactions   Loratadine Other (See Comments)    CLARITIN D -  Weak    Penicillins Other (See Comments)    DID THE REACTION INVOLVE: Swelling of the face/tongue/throat, SOB, or low BP? Unknown Sudden or severe rash/hives, skin peeling, or the inside of the mouth or nose? Unknown Did it require medical treatment? Unknown When did it last happen?    When she was young   If all above answers are "NO", may proceed with cephalosporin use.     Procedures This Admission:  1.Lead revision of a Medtronic PPM with revision of a Medtronic CapSureFix Novus [id number] on 12/07/23 by Dr. Daneil Dunker. There were no immediate post procedure complications 2.  CXR on 12/08/23 demonstrated stable lead placement and no pneumothorax status post lead revision  Brief HPI:  Lori Gay is a 63 y.o. female seen by Electrophysiology in the outpatient setting with increased RV threshold. She was seen by Michaelle Adolphus PA on 4/24 acutely with elevated RV threshold on new device (implanted 3/7). At this visit, RV threshold programmed unipolar at 2.5V at 0.20ms. RV lead testing on 5/1 with VVI, threshold 2.25V at 0.32ms. Given this, scheduled for venogram and potential lead revision. Past medical history includes above. Risk, benefits, and alternatives to implantable cardiac device system revision were reviewed with the patient who wished to proceed.   Hospital Course:  The patient was admitted on 12/07/2023 for implantable cardiac device revision due to increasing RV threshold with details  outlined as above.    The patient was examined and determined stable for discharge with close follow up as below. Appropriate AS/VP on telemetry.  Vitals:   12/07/23 2019 12/08/23 0013 12/08/23 0525 12/08/23 0801  BP:  104/67 110/64 117/69  Pulse:  73 79 61  Resp: 20 18 17 18   Temp: 97.9 F (36.6 C) 97.8 F (36.6 C) 98.2 F (36.8 C) 98.3 F (36.8 C)  TempSrc: Oral Oral Oral Oral  SpO2:  97% 99% 99%  Weight:   72.3 kg   Height:        Labs:   Lab Results  Component Value Date   WBC 5.6 12/06/2023   HGB 13.6 12/06/2023   HCT 39.2 12/06/2023   MCV 90 12/06/2023   PLT 181 12/06/2023    Recent Labs  Lab 12/06/23 1626  NA 143  K 3.8  CL 104  CO2 23  BUN 15  CREATININE 0.80  CALCIUM  8.9  GLUCOSE 92    Discharge Medications:  Allergies as of 12/08/2023       Reactions   Loratadine Other (See Comments)   CLARITIN D -  Weak   Penicillins Other (See Comments)   DID THE REACTION INVOLVE: Swelling of the face/tongue/throat, SOB, or low BP? Unknown Sudden or severe rash/hives, skin peeling, or the inside of the mouth or nose? Unknown Did it require medical treatment? Unknown When did it last happen?    When she was young   If all above answers are "NO", may proceed with cephalosporin use.        Medication List  TAKE these medications    acetaminophen  325 MG tablet Commonly known as: TYLENOL  Take 1-2 tablets (325-650 mg total) by mouth every 4 (four) hours as needed for mild pain (pain score 1-3).   celecoxib 100 MG capsule Commonly known as: CELEBREX Take 100 mg by mouth 2 (two) times daily.   cetirizine 10 MG tablet Commonly known as: ZYRTEC Take 10 mg by mouth daily.   clonazePAM  1 MG tablet Commonly known as: KLONOPIN  Take 1 tablet by mouth at bedtime as needed for anxiety.   gabapentin  300 MG capsule Commonly known as: NEURONTIN  Take 300 mg by mouth 3 (three) times daily.   KRILL OIL PO Take 1,200 mg by mouth daily.   rosuvastatin  5 MG  tablet Commonly known as: CRESTOR  Take 1 tablet (5 mg total) by mouth daily.        Disposition: Pt is being discharged home today in good condition with usual follow up in place. Discharge Instructions     Diet - low sodium heart healthy   Complete by: As directed    Increase activity slowly   Complete by: As directed         Signed, Lori Prince, PA-C  12/08/2023 12:03 PM   I have seen, examined the patient, and reviewed the above assessment and plan.    Hospital course: Patient presented with RV lead malfunction.  She underwent implant of new RV pacing lead and extraction of her old RV lead.  She was kept overnight for monitoring.  There were no acute overnight events.  On postop day 1, she reported feeling relatively well with no new or acute complaints.  Postoperative chest x-ray showed stable lead position and no pneumothorax.  Bedside device interrogation showed appropriate device function and stable lead parameters.  Usual post implant discharge teaching was performed regarding activity restrictions and wound care.  She was discharged home.  GEN: No acute distress.   Neck: No JVD Cardiac: Normal rate. Right chest pacer pocket without hematoma or bleeding. Respiratory: Clear to auscultation bilaterally. GI: Soft, nontender, non-distended  MS: No edema  Plan: Patient will follow-up in device clinic in 10 to 14 days.  Duration of Discharge Encounter: 50 minutes MD Time: 30 minutes  Ardeen Kohler, MD 12/14/2023 1:23 PM

## 2023-12-11 MED FILL — Midazolam HCl Inj 2 MG/2ML (Base Equivalent): INTRAMUSCULAR | Qty: 1 | Status: AC

## 2023-12-11 MED FILL — Midazolam HCl Inj 2 MG/2ML (Base Equivalent): INTRAMUSCULAR | Qty: 2 | Status: AC

## 2023-12-20 ENCOUNTER — Ambulatory Visit: Attending: Cardiovascular Disease

## 2023-12-20 DIAGNOSIS — I442 Atrioventricular block, complete: Secondary | ICD-10-CM | POA: Diagnosis present

## 2023-12-20 NOTE — Progress Notes (Signed)
 Dual chamber pacemaker wound check. Presenting rhythm: AS/VP 70 . Wound well healed. Routine testing performed. Atrial thresholds, sensing, and impedances consistent with implant measurements; however, RV demonstrates high output. No episodes noted. Patient is enrolled in remote follow-up.    RV threshold testing today bipolar: 3.25@1 .0ms.  Unipolar 3.0@1 .0ms.  Patient complaints of intermittent dizziness and heart rates dropping < 60 on smart watch. (see attached lead trends and RV testing graphs). Patient is programmed 5.0V@1 .0ms for appropriate safety margin.  Patient is device dependent.   Reviewed immediately with A. Tillery PA-C and Dr. Daneil Dunker.  Plans for explant of entire right sided system and implant of new system on the left.  Dr. Carolynne Citron aware and plans to perform procedure on Tuesday, May 20th.  Patient is aware and prep instructions given today with procedure information before leaving.  Provided ample time to address all of patient's questions and concerns.  She is highly anxious but in agreement with plan to move forward with explant and re-implant.  ER  precautions given should she experience any severe symptoms including syncope or near syncopal events between now and Tuesday.

## 2023-12-20 NOTE — Patient Instructions (Signed)
   After Your Pacemaker   Monitor your pacemaker site for redness, swelling, and drainage. Call the device clinic at [phone] if you experience these symptoms or fever/chills.  Your incision was closed with Steri-strips or staples:  You may shower 7 days after your procedure and wash your incision with soap and water. Avoid lotions, ointments, or perfumes over your incision until it is well-healed.  You may use a hot tub or a pool after your wound check appointment if the incision is completely closed.

## 2023-12-25 ENCOUNTER — Other Ambulatory Visit: Payer: Self-pay

## 2023-12-25 ENCOUNTER — Ambulatory Visit (HOSPITAL_COMMUNITY)
Admission: RE | Admit: 2023-12-25 | Discharge: 2023-12-26 | Disposition: A | Discharge status: Home / Self Care | Attending: Internal Medicine | Admitting: Internal Medicine

## 2023-12-25 ENCOUNTER — Encounter (HOSPITAL_COMMUNITY)
Admission: RE | Disposition: A | Discharge status: Home / Self Care | Payer: Self-pay | Source: Home / Self Care | Attending: Internal Medicine

## 2023-12-25 ENCOUNTER — Encounter: Payer: Self-pay | Admitting: Emergency Medicine

## 2023-12-25 DIAGNOSIS — I442 Atrioventricular block, complete: Secondary | ICD-10-CM | POA: Diagnosis present

## 2023-12-25 DIAGNOSIS — T82110A Breakdown (mechanical) of cardiac electrode, initial encounter: Secondary | ICD-10-CM | POA: Diagnosis not present

## 2023-12-25 DIAGNOSIS — Y831 Surgical operation with implant of artificial internal device as the cause of abnormal reaction of the patient, or of later complication, without mention of misadventure at the time of the procedure: Secondary | ICD-10-CM | POA: Insufficient documentation

## 2023-12-25 HISTORY — PX: LEAD EXTRACTION: EP1211

## 2023-12-25 HISTORY — PX: LEAD INSERTION: EP1212

## 2023-12-25 HISTORY — PX: PPM GENERATOR CHANGEOUT: EP1233

## 2023-12-25 SURGERY — PPM GENERATOR CHANGEOUT

## 2023-12-25 MED ORDER — SODIUM CHLORIDE 0.9 % IV SOLN
INTRAVENOUS | Status: AC
  Filled 2023-12-25: qty 2

## 2023-12-25 MED ORDER — MIDAZOLAM HCL 5 MG/5ML IJ SOLN
INTRAMUSCULAR | Status: AC
  Filled 2023-12-25: qty 5

## 2023-12-25 MED ORDER — SODIUM CHLORIDE 0.9 % IV SOLN
INTRAVENOUS | Status: DC
Start: 2023-12-25 — End: 2023-12-25

## 2023-12-25 MED ORDER — MIDAZOLAM HCL 2 MG/2ML IJ SOLN
INTRAMUSCULAR | Status: AC
  Filled 2023-12-25: qty 2

## 2023-12-25 MED ORDER — OXYCODONE HCL 5 MG PO TABS: 5.0000 mg | ORAL_TABLET | Freq: Once | ORAL | Status: DC | PRN

## 2023-12-25 MED ORDER — ACETAMINOPHEN 325 MG PO TABS
325.0000 mg | ORAL_TABLET | ORAL | Status: DC | PRN
  Administered 2023-12-25 – 2023-12-26 (×5): 650 mg via ORAL
  Filled 2023-12-25 (×5): qty 2

## 2023-12-25 MED ORDER — CEFAZOLIN SODIUM-DEXTROSE 1-4 GM/50ML-% IV SOLN
1.0000 g | Freq: Four times a day (QID) | INTRAVENOUS | Status: AC
  Administered 2023-12-25 – 2023-12-26 (×3): 1 g via INTRAVENOUS
  Filled 2023-12-25 (×3): qty 50

## 2023-12-25 MED ORDER — CEFAZOLIN SODIUM-DEXTROSE 2-4 GM/100ML-% IV SOLN
INTRAVENOUS | Status: AC
  Filled 2023-12-25: qty 100

## 2023-12-25 MED ORDER — FENTANYL CITRATE (PF) 100 MCG/2ML IJ SOLN
INTRAMUSCULAR | Status: DC | PRN
  Administered 2023-12-25: 25 ug via INTRAVENOUS
  Administered 2023-12-25 (×5): 12.5 ug via INTRAVENOUS
  Administered 2023-12-25 (×2): 25 ug via INTRAVENOUS

## 2023-12-25 MED ORDER — CLONAZEPAM 0.5 MG PO TABS: 1.0000 mg | ORAL_TABLET | Freq: Three times a day (TID) | ORAL | Status: DC | PRN

## 2023-12-25 MED ORDER — POVIDONE-IODINE 10 % EX SWAB
2.0000 | Freq: Once | CUTANEOUS | Status: AC
  Administered 2023-12-25: 2 via TOPICAL

## 2023-12-25 MED ORDER — CHLORHEXIDINE GLUCONATE 4 % EX SOLN
4.0000 | Freq: Once | CUTANEOUS | Status: DC
  Filled 2023-12-25: qty 60

## 2023-12-25 MED ORDER — HEPARIN (PORCINE) IN NACL 1000-0.9 UT/500ML-% IV SOLN
INTRAVENOUS | Status: DC | PRN
  Administered 2023-12-25: 500 mL

## 2023-12-25 MED ORDER — ONDANSETRON HCL 4 MG/2ML IJ SOLN: 4.0000 mg | Freq: Four times a day (QID) | INTRAMUSCULAR | Status: DC | PRN

## 2023-12-25 MED ORDER — FENTANYL CITRATE (PF) 100 MCG/2ML IJ SOLN
INTRAMUSCULAR | Status: AC
  Filled 2023-12-25: qty 2

## 2023-12-25 MED ORDER — MIDAZOLAM HCL 5 MG/5ML IJ SOLN
INTRAMUSCULAR | Status: AC
Start: 2023-12-25 — End: ?
  Filled 2023-12-25: qty 5

## 2023-12-25 MED ORDER — LIDOCAINE HCL 1 % IJ SOLN
INTRAMUSCULAR | Status: AC
  Filled 2023-12-25: qty 20

## 2023-12-25 MED ORDER — SODIUM CHLORIDE 0.9 % IV SOLN
80.0000 mg | INTRAVENOUS | Status: AC
  Administered 2023-12-25 (×2): 80 mg

## 2023-12-25 MED ORDER — LIDOCAINE HCL (PF) 1 % IJ SOLN
INTRAMUSCULAR | Status: AC
  Filled 2023-12-25: qty 90

## 2023-12-25 MED ORDER — CEFAZOLIN SODIUM-DEXTROSE 2-4 GM/100ML-% IV SOLN
2.0000 g | INTRAVENOUS | Status: AC
  Administered 2023-12-25: 2 g via INTRAVENOUS

## 2023-12-25 MED ORDER — LIDOCAINE HCL (PF) 1 % IJ SOLN
INTRAMUSCULAR | Status: DC | PRN
  Administered 2023-12-25: 60 mL
  Administered 2023-12-25: 30 mL

## 2023-12-25 MED ORDER — MIDAZOLAM HCL 5 MG/5ML IJ SOLN
INTRAMUSCULAR | Status: DC | PRN
  Administered 2023-12-25 (×3): 1 mg via INTRAVENOUS
  Administered 2023-12-25: 2 mg via INTRAVENOUS
  Administered 2023-12-25: 1 mg via INTRAVENOUS
  Administered 2023-12-25: 2 mg via INTRAVENOUS
  Administered 2023-12-25: 1 mg via INTRAVENOUS
  Administered 2023-12-25: 2 mg via INTRAVENOUS

## 2023-12-25 SURGICAL SUPPLY — 11 items
CABLE SURGICAL S-101-97-12 (CABLE) ×2 IMPLANT
IPG PACE AZUR XT DR MRI W1DR01 (Pacemaker) IMPLANT
KIT MICROPUNCTURE NIT STIFF (SHEATH) IMPLANT
KIT STYLET 52CM (MISCELLANEOUS) IMPLANT
LEAD CAPSURE NOVUS 5076-52CM (Lead) IMPLANT
LEAD PASSIVE MRI 4074-58 (Lead) IMPLANT
PACEMAKER STYLET GRAY 58 (MISCELLANEOUS) IMPLANT
PAD DEFIB RADIO PHYSIO CONN (PAD) ×2 IMPLANT
SHEATH 7FR PRELUDE SNAP 13 (SHEATH) IMPLANT
TRAY PACEMAKER INSERTION (PACKS) ×2 IMPLANT
WIRE HI TORQ VERSACORE-J 145CM (WIRE) IMPLANT

## 2023-12-25 NOTE — Interval H&P Note (Signed)
 History and Physical Interval Note:  12/25/2023 1:50 PM  Lori Gay  has presented today for surgery, with the diagnosis of lead malfunction.  The various methods of treatment have been discussed with the patient and family. After consideration of risks, benefits and other options for treatment, the patient has consented to  Procedure(s): PPM GENERATOR CHANGEOUT (N/A) LEAD EXTRACTION (N/A) LEAD INSERTION (N/A) as a surgical intervention.  The patient's history has been reviewed, patient examined, no change in status, stable for surgery.  I have reviewed the patient's chart and labs.  Questions were answered to the patient's satisfaction.     Manya Sells

## 2023-12-26 ENCOUNTER — Encounter (HOSPITAL_COMMUNITY): Payer: Self-pay | Admitting: Internal Medicine

## 2023-12-26 ENCOUNTER — Ambulatory Visit (HOSPITAL_COMMUNITY)

## 2023-12-26 DIAGNOSIS — I442 Atrioventricular block, complete: Secondary | ICD-10-CM

## 2023-12-26 DIAGNOSIS — T82110A Breakdown (mechanical) of cardiac electrode, initial encounter: Secondary | ICD-10-CM | POA: Diagnosis not present

## 2023-12-26 MED ORDER — ACETAMINOPHEN 325 MG PO TABS
325.0000 mg | ORAL_TABLET | ORAL | Status: AC | PRN
Start: 2023-12-26 — End: ?

## 2023-12-26 NOTE — Discharge Summary (Addendum)
 ELECTROPHYSIOLOGY PROCEDURE DISCHARGE SUMMARY    Patient ID: Lori Gay,  MRN: [id number], DOB/AGE: [birth year] 62 y.o.  Admit date: 12/25/2023 Discharge date: 12/26/2023  Primary Care Physician: Spero Dye, PA-C  Primary Cardiologist: None  Electrophysiologist: Dr. Carolynne Citron   Primary Discharge Diagnosis:  RV Lead malfunction Complete heart block status post pacemaker explantation and re-implantation on contralateral side this admission   Allergies  Allergen Reactions   Loratadine Other (See Comments)    CLARITIN D -  Weak    Penicillins Other (See Comments)    DID THE REACTION INVOLVE: Swelling of the face/tongue/throat, SOB, or low BP? Unknown Sudden or severe rash/hives, skin peeling, or the inside of the mouth or nose? Unknown Did it require medical treatment? Unknown When did it last happen?    When she was young   If all above answers are "NO", may proceed with cephalosporin use.      Procedures This Admission:  1.  Implantation of a Medtronic Dual Chamber PPM on 12/25/2022 by Dr. Carolynne Citron. The patient received a Medtronic Azure XT DR [id number]  with a Medtronic CapSureFix Novus [id number] right atrial lead and a Medtronic Passive MRI [id number] right ventricular lead.  There were no immediate post procedure complications.   2.  CXR on 12/26/2023 demonstrated no pneumothorax status post device implantation.       Brief HPI: Lori Gay is a 63 y.o. female has been followed by EP with very complicated recent history.  Pt had previously RV lead failure of a right sided device resulting in extraction and revision.  Subsequently had recurrent failure of a Left bundle lead with recurrent elevated threshold requiring revision where an Apical RV lead was placed.   At wound check was noted to again have lead failure with threshold > 3.0V.   Given multiple right sided device failures, Risks, benefits, and alternatives to device explantation and left sided PPM implantation  were reviewed with the patient who wished to proceed.   Hospital Course:  The patient was admitted and underwent explantation of a RIGHT sided PPM given multiple RV lead failures and subsequent implantation of a LEFT sided Medtronic dual chamber PPM with details as outlined above.  She was monitored on telemetry overnight which demonstrated appropriate pacing.  Left chest was without hematoma or ecchymosis.  The device was interrogated and found to be functioning normally.  CXR was obtained and demonstrated no pneumothorax status post device implantation.  Wound care, arm mobility, and restrictions were reviewed with the patient.  The patient was examined and considered stable for discharge to home.    Anticoagulation resumption This patient is not on anticoagulation     Physical Exam: Vitals:   12/26/23 0049 12/26/23 0446 12/26/23 0814 12/26/23 1126  BP: (!) 141/75 132/79 138/75 (!) 140/77  Pulse: 66  60 72  Resp: 19 14 17 15   Temp: 97.9 F (36.6 C) 97.6 F (36.4 C) 98 F (36.7 C) 98 F (36.7 C)  TempSrc: Oral Oral Oral Oral  SpO2: 96% 97% 97% 97%  Weight:  73 kg    Height:        GEN- NAD. A&O x 3.  HEENT: Normocephalic, atraumatic Lungs- CTAB, Normal effort.  Heart- RRR, No M/G/R.  GI- Soft, NT, ND.  Extremities- No clubbing, cyanosis, or edema;  Skin- warm and dry, no rash or lesion, left chest without hematoma/ecchymosis  Discharge Medications:  Allergies as of 12/26/2023       Reactions  Loratadine Other (See Comments)   CLARITIN D -  Weak   Penicillins Other (See Comments)   DID THE REACTION INVOLVE: Swelling of the face/tongue/throat, SOB, or low BP? Unknown Sudden or severe rash/hives, skin peeling, or the inside of the mouth or nose? Unknown Did it require medical treatment? Unknown When did it last happen?    When she was young   If all above answers are "NO", may proceed with cephalosporin use.        Medication List     TAKE these medications     acetaminophen  325 MG tablet Commonly known as: TYLENOL  Take 1-2 tablets (325-650 mg total) by mouth every 4 (four) hours as needed for mild pain (pain score 1-3).   celecoxib 100 MG capsule Commonly known as: CELEBREX Take 100 mg by mouth 2 (two) times daily.   cetirizine 10 MG tablet Commonly known as: ZYRTEC Take 10 mg by mouth in the morning.   clonazePAM  1 MG tablet Commonly known as: KLONOPIN  Take 1 tablet by mouth at bedtime as needed for anxiety.   gabapentin  300 MG capsule Commonly known as: NEURONTIN  Take 300 mg by mouth 2 (two) times daily.   KRILL OIL PO Take 1,200 mg by mouth daily with lunch.   rosuvastatin  5 MG tablet Commonly known as: CRESTOR  Take 1 tablet (5 mg total) by mouth daily.        Disposition:  Home with usual follow up as in AVS   Duration of Discharge Encounter:  APP time: 26 minutes  Signed, Tylene Galla, PA-C  12/26/2023 11:37 AM   EP Attending  Patient seen and examined. Agree with above.Her passive RV lead looks good.  She is stable for DC home. Interrogation of her device demonstrates normal function.  Usual followup. CXR looks good.   Pete Brand Zaylon Bossier,MD

## 2023-12-26 NOTE — Discharge Instructions (Addendum)
 After Your Pacemaker   You have a Medtronic Pacemaker  ACTIVITY Do not lift your arm above shoulder height for 2 weeks after your procedure. After 14 days, you may progress as below.  You should remove your sling 24 hours after your procedure, unless otherwise instructed by your provider.    Wednesday January 09, 2023  Thursday January 10, 2024, Friday January 11 2024, Saturday January 12 2024   Do not lift, push, pull, or carry anything over 10 pounds with the affected arm until 6 weeks (Wednesday February 06, 2024 ) after your procedure.   You may drive AFTER your wound check, unless you have been told otherwise by your provider.   Ask your healthcare provider when you can go back to work   INCISION/Dressing If you are on a blood thinner such as Coumadin, Xarelto, Eliquis, Plavix, or Pradaxa please confirm with your provider when this should be resumed.   If large square, outer bandage is left in place, this can be removed after 24 hours from your procedure. Do not remove steri-strips or glue as below.   If a PRESSURE DRESSING (a bulky dressing that usually goes up over your shoulder) was applied or left in place, please follow instructions given by your provider on when to return to have this removed.   Monitor your Pacemaker site for redness, swelling, and drainage. Call the device clinic at [phone] if you experience these symptoms or fever/chills.  If your incision is sealed with Steri-strips or staples, you may shower 7 days after your procedure or when told by your provider. Do not remove the steri-strips or let the shower hit directly on your site. You may wash around your site with soap and water.    If you were discharged in a sling, please do not wear this during the day more than 48 hours after your surgery unless otherwise instructed. This may increase the risk of stiffness and soreness in your shoulder.   Avoid lotions, ointments, or perfumes over your incision until it is  well-healed.  You may use a hot tub or a pool AFTER your wound check appointment if the incision is completely closed.  Pacemaker Alerts:  Some alerts are vibratory and others beep. These are NOT emergencies. Please call our office to let us  know. If this occurs at night or on weekends, it can wait until the next business day. Send a remote transmission.  If your device is capable of reading fluid status (for heart failure), you will be offered monthly monitoring to review this with you.   DEVICE MANAGEMENT Remote monitoring is used to monitor your pacemaker from home. This monitoring is scheduled every 91 days by our office. It allows us  to keep an eye on the functioning of your device to ensure it is working properly. You will routinely see your Electrophysiologist annually (more often if necessary).   You should receive your ID card for your new device in 4-8 weeks. Keep this card with you at all times once received. Consider wearing a medical alert bracelet or necklace.  Your Pacemaker may be MRI compatible. This will be discussed at your next office visit/wound check.  You should avoid contact with strong electric or magnetic fields.   Do not use amateur (ham) radio equipment or electric (arc) welding torches. MP3 player headphones with magnets should not be used. Some devices are safe to use if held at least 12 inches (30 cm) from your Pacemaker. These include power tools, lawn mowers,  and speakers. If you are unsure if something is safe to use, ask your health care provider.  When using your cell phone, hold it to the ear that is on the opposite side from the Pacemaker. Do not leave your cell phone in a pocket over the Pacemaker.  You may safely use electric blankets, heating pads, computers, and microwave ovens.  Call the office right away if: You have chest pain. You feel more short of breath than you have felt before. You feel more light-headed than you have felt before. Your  incision starts to open up.  This information is not intended to replace advice given to you by your health care provider. Make sure you discuss any questions you have with your health care provider.

## 2023-12-26 NOTE — TOC Transition Note (Signed)
 Transition of Care [facility]) - Discharge Note   Patient Details  Name: Lori Gay MRN: [id number] Date of Birth: Feb 21, 1961  [facility] CM/SW Contact:  Jennett Model, RN Phone Number: 12/26/2023, 12:40 PM   Clinical Narrative:    For dc today, daughter at bedside to transport home. She has no needs.         Patient Goals and CMS Choice            Discharge Placement                       Discharge Plan and Services Additional resources added to the After Visit Summary for                                       Social Drivers of Health (SDOH) Interventions SDOH Screenings   Food Insecurity: No Food Insecurity (12/25/2023)  Housing: Low Risk  (12/25/2023)  Transportation Needs: No Transportation Needs (12/25/2023)  Utilities: Not At Risk (12/25/2023)  Physical Activity: Unknown (02/16/2022)   [facility] prior to 10/07/[facility]22., [facility]  Social Connections: Unknown (02/16/2022)   [facility]  Tobacco Use: Medium Risk (12/26/2023)     Readmission Risk Interventions     No data to display

## 2023-12-26 NOTE — TOC CM/SW Note (Signed)
 Transition of Care [facility]) - Inpatient Brief Assessment   Patient Details  Name: Lori Gay MRN: [id number] Date of Birth: February 10, 1961  Transition of Care [facility]) CM/SW Contact:    Jennett Model, RN Phone Number: 12/26/2023, 12:38 PM   Clinical Narrative: From home alone, has PCP and insurance on file, states has no HH services in place at this time or DME at home.  States family member will transport them home at [employer] and family is support system, states gets medications from [facility] in [place].  Pta self ambulatory. Patient gives this NCM permission to speak with daughter at bedside.   Transition of Care Asessment: Insurance and Status: Insurance coverage has been reviewed Patient has primary care physician: Yes Home environment has been reviewed: home alone Prior level of function:: indep Prior/Current Home Services: No current home services Social Drivers of Health Review: SDOH reviewed no interventions necessary Readmission risk has been reviewed: Yes Transition of care needs: no transition of care needs at this time

## 2024-01-07 ENCOUNTER — Ambulatory Visit: Payer: Medicaid Other | Admitting: Cardiology

## 2024-01-08 ENCOUNTER — Ambulatory Visit: Attending: Cardiology

## 2024-01-08 DIAGNOSIS — I442 Atrioventricular block, complete: Secondary | ICD-10-CM | POA: Diagnosis present

## 2024-01-08 LAB — CUP PACEART INCLINIC DEVICE CHECK
Battery Remaining Longevity: 142 mo
Battery Voltage: 3.21 V
Brady Statistic AP VP Percent: 19.6 %
Brady Statistic AP VS Percent: 0 %
Brady Statistic AS VP Percent: 80.39 %
Brady Statistic AS VS Percent: 0.01 %
Brady Statistic RA Percent Paced: 19.57 %
Brady Statistic RV Percent Paced: 99.99 %
Date Time Interrogation Session: 20250603155146
Implantable Lead Connection Status: 753985
Implantable Lead Connection Status: 753985
Implantable Lead Implant Date: 20250520
Implantable Lead Implant Date: 20250520
Implantable Lead Location: 753859
Implantable Lead Location: 753860
Implantable Lead Model: 4074
Implantable Lead Model: 5076
Implantable Pulse Generator Implant Date: 20250520
Lead Channel Impedance Value: 361 Ohm
Lead Channel Impedance Value: 437 Ohm
Lead Channel Impedance Value: 551 Ohm
Lead Channel Impedance Value: 684 Ohm
Lead Channel Pacing Threshold Amplitude: 0.5 V
Lead Channel Pacing Threshold Amplitude: 0.5 V
Lead Channel Pacing Threshold Pulse Width: 0.4 ms
Lead Channel Pacing Threshold Pulse Width: 0.4 ms
Lead Channel Sensing Intrinsic Amplitude: 17.375 mV
Lead Channel Sensing Intrinsic Amplitude: 3.375 mV
Lead Channel Sensing Intrinsic Amplitude: 3.875 mV
Lead Channel Setting Pacing Amplitude: 3.5 V
Lead Channel Setting Pacing Amplitude: 3.5 V
Lead Channel Setting Pacing Pulse Width: 0.4 ms
Lead Channel Setting Sensing Sensitivity: 1.2 mV
Zone Setting Status: 755011

## 2024-01-08 NOTE — Progress Notes (Signed)
 Normal dual chamber pacemaker wound check. Presenting rhythm: AS/VP-82 . Bilateral incision sites well healed. Routine testing performed. Thresholds, sensing, and impedances consistent with implant measurements and at 3.5V safety margin/auto capture until 3 month visit. No episodes. Reviewed arm restrictions to continue for 6 weeks total post op.  Pt enrolled in remote follow-up.

## 2024-01-08 NOTE — Patient Instructions (Signed)

## 2024-01-14 ENCOUNTER — Ambulatory Visit: Admitting: Pulmonary Disease

## 2024-01-15 ENCOUNTER — Ambulatory Visit: Payer: Self-pay | Admitting: Cardiology

## 2024-01-17 ENCOUNTER — Encounter

## 2024-02-06 ENCOUNTER — Telehealth: Payer: Self-pay

## 2024-02-06 NOTE — Telephone Encounter (Addendum)
 Alert received from [employer]:  Point of Service transmission:  Presenting rhythm AFL/VP at lower rate limit of 35, VVI Normal device function.  Generator change 5/20, 6/10 EPIC note pt AS/VP.  No hx of AF, no OAC per EPIC  Outreach made to [employer].  Per Hannah-transmission was obtained at [facility] on 02/01/2024 at 7:44 am.   Discussed with Dr. Inocencio.  Will attempt to contact Pt.  Outreach made to Pt. Per Pt she was NOT at the [facility] on 02/01/2024.  She is feeling well.  Follow up call received from [employer].  This transmission was sent in error.  Transmission received was from device previously removed from Pt and being used for education.  No action needed.

## 2024-03-07 ENCOUNTER — Encounter: Admitting: Student

## 2024-03-10 ENCOUNTER — Encounter

## 2024-03-26 ENCOUNTER — Ambulatory Visit (INDEPENDENT_AMBULATORY_CARE_PROVIDER_SITE_OTHER)

## 2024-03-26 DIAGNOSIS — I442 Atrioventricular block, complete: Secondary | ICD-10-CM | POA: Diagnosis not present

## 2024-03-27 ENCOUNTER — Ambulatory Visit: Payer: Self-pay | Admitting: Cardiology

## 2024-03-27 LAB — CUP PACEART REMOTE DEVICE CHECK
Battery Remaining Longevity: 154 mo
Battery Voltage: 3.19 V
Brady Statistic AP VP Percent: 9.6 %
Brady Statistic AP VS Percent: 0 %
Brady Statistic AS VP Percent: 90.18 %
Brady Statistic AS VS Percent: 0.22 %
Brady Statistic RA Percent Paced: 9.59 %
Brady Statistic RV Percent Paced: 99.77 %
Date Time Interrogation Session: 20250819191829
Implantable Lead Connection Status: 753985
Implantable Lead Connection Status: 753985
Implantable Lead Implant Date: 20250520
Implantable Lead Implant Date: 20250520
Implantable Lead Location: 753859
Implantable Lead Location: 753860
Implantable Lead Model: 4074
Implantable Lead Model: 5076
Implantable Pulse Generator Implant Date: 20250520
Lead Channel Impedance Value: 361 Ohm
Lead Channel Impedance Value: 456 Ohm
Lead Channel Impedance Value: 532 Ohm
Lead Channel Impedance Value: 627 Ohm
Lead Channel Pacing Threshold Amplitude: 0.5 V
Lead Channel Pacing Threshold Amplitude: 0.625 V
Lead Channel Pacing Threshold Pulse Width: 0.4 ms
Lead Channel Pacing Threshold Pulse Width: 0.4 ms
Lead Channel Sensing Intrinsic Amplitude: 14.75 mV
Lead Channel Sensing Intrinsic Amplitude: 14.75 mV
Lead Channel Sensing Intrinsic Amplitude: 4 mV
Lead Channel Sensing Intrinsic Amplitude: 4 mV
Lead Channel Setting Pacing Amplitude: 1.5 V
Lead Channel Setting Pacing Amplitude: 2 V
Lead Channel Setting Pacing Pulse Width: 0.4 ms
Lead Channel Setting Sensing Sensitivity: 1.2 mV
Zone Setting Status: 755011

## 2024-04-02 ENCOUNTER — Ambulatory Visit: Payer: Self-pay | Admitting: Cardiology

## 2024-04-02 ENCOUNTER — Ambulatory Visit: Attending: Cardiovascular Disease | Admitting: Internal Medicine

## 2024-04-02 ENCOUNTER — Encounter: Payer: Self-pay | Admitting: Internal Medicine

## 2024-04-02 VITALS — BP 108/78 | HR 78 | Ht 64.0 in | Wt 165.0 lb

## 2024-04-02 DIAGNOSIS — I442 Atrioventricular block, complete: Secondary | ICD-10-CM | POA: Diagnosis present

## 2024-04-02 LAB — CUP PACEART INCLINIC DEVICE CHECK
Battery Remaining Longevity: 154 mo
Battery Voltage: 3.19 V
Brady Statistic AP VP Percent: 12.76 %
Brady Statistic AP VS Percent: 0 %
Brady Statistic AS VP Percent: 87.18 %
Brady Statistic AS VS Percent: 0.05 %
Brady Statistic RA Percent Paced: 12.75 %
Brady Statistic RV Percent Paced: 99.94 %
Date Time Interrogation Session: 20250827125956
Implantable Lead Connection Status: 753985
Implantable Lead Connection Status: 753985
Implantable Lead Implant Date: 20250520
Implantable Lead Implant Date: 20250520
Implantable Lead Location: 753859
Implantable Lead Location: 753860
Implantable Lead Model: 4074
Implantable Lead Model: 5076
Implantable Pulse Generator Implant Date: 20250520
Lead Channel Impedance Value: 380 Ohm
Lead Channel Impedance Value: 456 Ohm
Lead Channel Impedance Value: 551 Ohm
Lead Channel Impedance Value: 646 Ohm
Lead Channel Pacing Threshold Amplitude: 0.5 V
Lead Channel Pacing Threshold Amplitude: 0.625 V
Lead Channel Pacing Threshold Pulse Width: 0.4 ms
Lead Channel Pacing Threshold Pulse Width: 0.4 ms
Lead Channel Sensing Intrinsic Amplitude: 14.75 mV
Lead Channel Sensing Intrinsic Amplitude: 16 mV
Lead Channel Sensing Intrinsic Amplitude: 3.375 mV
Lead Channel Sensing Intrinsic Amplitude: 3.625 mV
Lead Channel Setting Pacing Amplitude: 1.5 V
Lead Channel Setting Pacing Amplitude: 2 V
Lead Channel Setting Pacing Pulse Width: 0.4 ms
Lead Channel Setting Sensing Sensitivity: 1.2 mV
Zone Setting Status: 755011

## 2024-04-02 NOTE — Progress Notes (Signed)
 HPI Lori Gay returns today for followup. She is a pleasant 63 yo woman, a former patient of Dr. MILUS who developed heart block after ablation and underwent PPM insertion. Her RV lead developed an elevated pacing threshold and she underwent extraction several months ago followed by more elevation of her pacing threshold s/p another revision. In the interim she has done well. She has some residual pain over her insertion site on both sides.  Allergies  Allergen Reactions   Loratadine Other (See Comments)    CLARITIN D -  Weak    Penicillins Other (See Comments)    DID THE REACTION INVOLVE: Swelling of the face/tongue/throat, SOB, or low BP? Unknown Sudden or severe rash/hives, skin peeling, or the inside of the mouth or nose? Unknown Did it require medical treatment? Unknown When did it last happen?    When she was young   If all above answers are "NO", may proceed with cephalosporin use.      Current Outpatient Medications  Medication Sig Dispense Refill   acetaminophen  (TYLENOL ) 325 MG tablet Take 1-2 tablets (325-650 mg total) by mouth every 4 (four) hours as needed for mild pain (pain score 1-3).     celecoxib (CELEBREX) 100 MG capsule Take 100 mg by mouth 2 (two) times daily.     cetirizine (ZYRTEC) 10 MG tablet Take 10 mg by mouth in the morning.     clonazePAM  (KLONOPIN ) 1 MG tablet Take 1 tablet by mouth at bedtime as needed for anxiety.     gabapentin  (NEURONTIN ) 300 MG capsule Take 300 mg by mouth 2 (two) times daily.     KRILL OIL PO Take 1,200 mg by mouth daily with lunch.     rosuvastatin  (CRESTOR ) 5 MG tablet Take 1 tablet (5 mg total) by mouth daily. 90 tablet 0   No current facility-administered medications for this visit.     Past Medical History:  Diagnosis Date   Anxiety    AVNRT (AV nodal re-entry tachycardia) (HCC)    s/p slow pathway ablation by Dr Kelsie   DDD (degenerative disc disease)    back surgery x2   History of kidney stones    Presence  of permanent cardiac pacemaker    Second degree AV block    s/p MDT pacemaker by Dr Fernande with subsequent generator change   Tobacco user     ROS:   All systems reviewed and negative except as noted in the HPI.   Past Surgical History:  Procedure Laterality Date   ATRIAL ABLATION SURGERY  05/07/2010   slow pathway; for inducible AVNRT   LEAD EXTRACTION N/A 10/12/2023   Procedure: LEAD EXTRACTION;  Surgeon: Waddell Danelle ORN, MD;  Location: [facility] INVASIVE CV LAB;  Service: Cardiovascular;  Laterality: N/A;   LEAD EXTRACTION N/A 12/25/2023   Procedure: LEAD EXTRACTION;  Surgeon: Waddell Danelle ORN, MD;  Location: [facility] INVASIVE CV LAB;  Service: Cardiovascular;  Laterality: N/A;   LEAD INSERTION N/A 12/25/2023   Procedure: LEAD INSERTION;  Surgeon: Waddell Danelle ORN, MD;  Location: [facility] INVASIVE CV LAB;  Service: Cardiovascular;  Laterality: N/A;   LEAD REVISION/REPAIR N/A 12/07/2023   Procedure: LEAD REVISION/REPAIR;  Surgeon: Kennyth Chew, MD;  Location: [facility] INVASIVE CV LAB;  Service: Cardiovascular;  Laterality: N/A;   PACEMAKER INSERTION  06/07/2010   for AV block post ablation; Medtronic Revo by Dr Fernande   [facility] GENERATOR CHANGEOUT N/A 08/22/2018   Procedure: PPM GENERATOR HERMA;  Surgeon: Kelsie Agent, MD;  Location: [facility] INVASIVE CV LAB;  Service: Cardiovascular;  Laterality: N/A;   PPM GENERATOR CHANGEOUT N/A 10/12/2023   Procedure: PPM GENERATOR CHANGEOUT;  Surgeon: Waddell Danelle ORN, MD;  Location: [facility] INVASIVE CV LAB;  Service: Cardiovascular;  Laterality: N/A;   PPM GENERATOR CHANGEOUT N/A 12/25/2023   Procedure: PPM GENERATOR CHANGEOUT;  Surgeon: Waddell Danelle ORN, MD;  Location: [facility] INVASIVE CV LAB;  Service: Cardiovascular;  Laterality: N/A;   TONSILLECTOMY     TRANSESOPHAGEAL ECHOCARDIOGRAM (CATH LAB) N/A 10/12/2023   Procedure: TRANSESOPHAGEAL ECHOCARDIOGRAM;  Surgeon: Waddell Danelle ORN, MD;  Location: [facility] INVASIVE CV LAB;  Service: Cardiovascular;  Laterality: N/A;   TYMPANOSTOMY TUBE PLACEMENT      as a child and then one as an adult in left ear   UPPER EXTREMITY VENOGRAPHY  12/07/2023   Procedure: UPPER EXTREMITY VENOGRAPHY;  Surgeon: Kennyth Chew, MD;  Location: [facility] INVASIVE CV LAB;  Service: Cardiovascular;;     Family History  Problem Relation Age of Onset   Diabetes Mother    Hypertension Mother    Hypertension Father    Healthy Brother    Diabetes Unknown        family hx     Social History   Socioeconomic History   Marital status: Divorced    Spouse name: Not on file   Number of children: Not on file   Years of education: Not on file   Highest education level: Not on file  Occupational History   Not on file  Tobacco Use   Smoking status: Former    Current packs/day: 0.00    Average packs/day: 1 pack/day for 15.0 years (15.0 ttl pk-yrs)    Types: Cigarettes    Start date: 01/28/2000    Quit date: 01/28/2015    Years since quitting: 9.1   Smokeless tobacco: Never   Tobacco comments:    1 ppd x 15 years   Vaping Use   Vaping status: Never Used  Substance and Sexual Activity   Alcohol use: Not Currently   Drug use: Not Currently   Sexual activity: Not on file  Other Topics Concern   Not on file  Social History Narrative   Recently married, lives in [place]; works at [employer].    Social Drivers of Corporate investment banker Strain: Not on file  Food Insecurity: No Food Insecurity (12/25/2023)   Hunger Vital Sign    Worried About Running Out of Food in the Last Year: Never true    Ran Out of Food in the Last Year: Never true  Transportation Needs: No Transportation Needs (12/25/2023)   PRAPARE - Administrator, Civil Service (Medical): No    Lack of Transportation (Non-Medical): No  Physical Activity: Unknown (02/16/2022)   Received from [facility] visits prior to 10/07/2022., [facility]   Exercise Vital Sign    On average, how many days per week do you engage in moderate to strenuous exercise (like a  brisk walk)?: Patient declined    Minutes of Exercise per Session: Not on file  Stress: Not on file  Social Connections: Unknown (02/16/2022)   Received from [facility]   Social Connection and Isolation Panel    Frequency of Communication with Friends and Family: Not on file    How often do you get together with friends or relatives?: Patient declined    How often do you attend church or religious services?: Patient declined    Do you belong to  any clubs or organizations such as church groups, unions, fraternal or athletic groups, or school groups?: Patient declined    How often do you attend meetings of the clubs or organizations you belong to?: Patient declined    Are you married, widowed, divorced, separated, never married, or living with a partner?: Patient declined  Intimate Partner Violence: Not At Risk (12/25/2023)   Humiliation, Afraid, Rape, and Kick questionnaire    Fear of Current or Ex-Partner: No    Emotionally Abused: No    Physically Abused: No    Sexually Abused: No     BP 108/78   Pulse 78   Ht 5' 4 (1.626 m)   Wt 165 lb (74.8 kg)   SpO2 99%   BMI 28.32 kg/m   Physical Exam:  Well appearing NAD HEENT: Unremarkable Neck:  No JVD, no thyromegally Lymphatics:  No adenopathy Back:  No CVA tenderness Lungs:  Clear with no wheezes HEART:  Regular rate rhythm, no murmurs, no rubs, no clicks Abd:  soft, positive bowel sounds, no organomegally, no rebound, no guarding Ext:  2 plus pulses, no edema, no cyanosis, no clubbing Skin:  No rashes no nodules Neuro:  CN II through XII intact, motor grossly intact  EKG - nsr with p synch ventricular pacing  DEVICE  Normal device function.  See PaceArt for details.   Assess/Plan: CHB - she is stable s/p PPM insertion.  PPM - her Medtronic DDD PM is working normally. We will recheck in several months.   Danelle Ria Redcay,MD

## 2024-04-02 NOTE — Patient Instructions (Signed)
 Medication Instructions:  Your physician recommends that you continue on your current medications as directed. Please refer to the Current Medication list given to you today.  *If you need a refill on your cardiac medications before your next appointment, please call your pharmacy*  Lab Work: None ordered.  You may go to any [facility] Location for your lab work:  [facility] - [address] ([facility]) - [address] [phone] [address]  [facility] - [address]   [place]  - [address]   [facility] - [address] - [address] [facility]  - [address] ([facility]   If you have labs (blood work) drawn today and your tests are completely normal, you will receive your results only by: [employer] (if you have MyChart)  If you have any lab test that is abnormal or we need to change your treatment, we will call you or send a MyChart message to review the results.  Testing/Procedures: None ordered.  Follow-Up: At [facility], you and your health needs are our priority.  As part of our continuing mission to provide you with exceptional heart care, we have created designated Provider Care Teams.  These Care Teams include your primary Cardiologist (physician) and Advanced Practice Providers (APPs -  Physician Assistants and Nurse Practitioners) who all work together to provide you with the care you need, when you need it.   Your next appointment:   1 year(s)  The format for your next appointment:   In Person  Provider:   Agatha Horsfall, MD{or one of the following Advanced Practice Providers on your designated Care Team:   Mertha Abrahams, [place] Bambi Lever "Jonelle Neri" [place], [place] Neda Balk, NP  Note: Remote monitoring is used to monitor your Pacemaker/ ICD from home. This monitoring reduces the number of office visits required to check your  device to one time per year. It allows us  to keep an eye on the functioning of your device to ensure it is working properly.

## 2024-04-17 ENCOUNTER — Encounter

## 2024-04-30 NOTE — Progress Notes (Signed)
 Remote PPM Transmission

## 2024-06-09 ENCOUNTER — Encounter

## 2024-06-25 ENCOUNTER — Ambulatory Visit

## 2024-06-25 DIAGNOSIS — I441 Atrioventricular block, second degree: Secondary | ICD-10-CM | POA: Diagnosis not present

## 2024-06-26 LAB — CUP PACEART REMOTE DEVICE CHECK
Battery Remaining Longevity: 149 mo
Battery Voltage: 3.16 V
Brady Statistic AP VP Percent: 13.53 %
Brady Statistic AP VS Percent: 0 %
Brady Statistic AS VP Percent: 86.38 %
Brady Statistic AS VS Percent: 0.08 %
Brady Statistic RA Percent Paced: 13.52 %
Brady Statistic RV Percent Paced: 99.92 %
Date Time Interrogation Session: 20251118213755
Implantable Lead Connection Status: 753985
Implantable Lead Connection Status: 753985
Implantable Lead Implant Date: 20250520
Implantable Lead Implant Date: 20250520
Implantable Lead Location: 753859
Implantable Lead Location: 753860
Implantable Lead Model: 4074
Implantable Lead Model: 5076
Implantable Pulse Generator Implant Date: 20250520
Lead Channel Impedance Value: 323 Ohm
Lead Channel Impedance Value: 380 Ohm
Lead Channel Impedance Value: 494 Ohm
Lead Channel Impedance Value: 589 Ohm
Lead Channel Pacing Threshold Amplitude: 0.375 V
Lead Channel Pacing Threshold Amplitude: 0.625 V
Lead Channel Pacing Threshold Pulse Width: 0.4 ms
Lead Channel Pacing Threshold Pulse Width: 0.4 ms
Lead Channel Sensing Intrinsic Amplitude: 10 mV
Lead Channel Sensing Intrinsic Amplitude: 10 mV
Lead Channel Sensing Intrinsic Amplitude: 3.875 mV
Lead Channel Sensing Intrinsic Amplitude: 3.875 mV
Lead Channel Setting Pacing Amplitude: 1.5 V
Lead Channel Setting Pacing Amplitude: 2 V
Lead Channel Setting Pacing Pulse Width: 0.4 ms
Lead Channel Setting Sensing Sensitivity: 1.2 mV
Zone Setting Status: 755011

## 2024-06-27 NOTE — Progress Notes (Signed)
 Remote PPM Transmission

## 2024-07-02 ENCOUNTER — Ambulatory Visit: Payer: Self-pay | Admitting: Cardiology

## 2024-07-17 ENCOUNTER — Encounter

## 2024-08-26 ENCOUNTER — Telehealth (HOSPITAL_BASED_OUTPATIENT_CLINIC_OR_DEPARTMENT_OTHER): Payer: Self-pay | Admitting: *Deleted

## 2024-08-26 NOTE — Telephone Encounter (Signed)
 Pt has been scheduled tele preop appt 09/15/24. Med rec and consent are done.      Patient Consent for Virtual Visit        Lori Gay has provided verbal consent on 08/26/2024 for a virtual visit (video or telephone).   CONSENT FOR VIRTUAL VISIT FOR:  Lori Gay  By participating in this virtual visit I agree to the following:  I hereby voluntarily request, consent and authorize [facility] and its employed or contracted physicians, physician assistants, nurse practitioners or other licensed health care professionals (the Practitioner), to provide me with telemedicine health care services (the Services) as deemed necessary by the treating Practitioner. I acknowledge and consent to receive the Services by the Practitioner via telemedicine. I understand that the telemedicine visit will involve communicating with the Practitioner through live audiovisual communication technology and the disclosure of certain medical information by electronic transmission. I acknowledge that I have been given the opportunity to request an in-person assessment or other available alternative prior to the telemedicine visit and am voluntarily participating in the telemedicine visit.  I understand that I have the right to withhold or withdraw my consent to the use of telemedicine in the course of my care at any time, without affecting my right to future care or treatment, and that the Practitioner or I may terminate the telemedicine visit at any time. I understand that I have the right to inspect all information obtained and/or recorded in the course of the telemedicine visit and may receive copies of available information for a reasonable fee.  I understand that some of the potential risks of receiving the Services via telemedicine include:  Delay or interruption in medical evaluation due to technological equipment failure or disruption; Information transmitted may not be sufficient (e.g. poor  resolution of images) to allow for appropriate medical decision making by the Practitioner; and/or  In rare instances, security protocols could fail, causing a breach of personal health information.  Furthermore, I acknowledge that it is my responsibility to provide information about my medical history, conditions and care that is complete and accurate to the best of my ability. I acknowledge that Practitioner's advice, recommendations, and/or decision may be based on factors not within their control, such as incomplete or inaccurate data provided by me or distortions of diagnostic images or specimens that may result from electronic transmissions. I understand that the practice of medicine is not an exact science and that Practitioner makes no warranties or guarantees regarding treatment outcomes. I acknowledge that a copy of this consent can be made available to me via my patient portal [facility] MyChart), or I can request a printed copy by calling the office of [facility].    I understand that my insurance will be billed for this visit.   I have read or had this consent read to me. I understand the contents of this consent, which adequately explains the benefits and risks of the Services being provided via telemedicine.  I have been provided ample opportunity to ask questions regarding this consent and the Services and have had my questions answered to my satisfaction. I give my informed consent for the services to be provided through the use of telemedicine in my medical care

## 2024-08-26 NOTE — Telephone Encounter (Signed)
"  ° °  Pre-operative Risk Assessment    Patient Name: Lori Gay  DOB: 1960-12-12 MRN: [id number]   Date of last office visit: 04/02/24 DR. TAYLOR Date of next office visit: NONE   Request for Surgical Clearance    Procedure:  COLONOSCOPY  Date of Surgery:  Clearance 10/02/24                                Surgeon:  DR. BERT Surgeon's Group or Practice Name:  [facility] Phone number:  [phone] Fax number:  [phone]   Type of Clearance Requested:   - Medical    Type of Anesthesia:  PROPOFOL    Additional requests/questions:    Bonney Niels Jest   08/26/2024, 2:41 PM   "

## 2024-08-26 NOTE — Telephone Encounter (Signed)
 Pt has been scheduled tele preop appt 09/15/24. Med rec and consent are done.

## 2024-08-26 NOTE — Telephone Encounter (Signed)
" ° °  Name: Lori Gay  DOB: 04/23/1961  MRN: [id number]  Primary Cardiologist: None   Preoperative team, please contact this patient and set up a phone call appointment for further preoperative risk assessment. Please obtain consent and complete medication review. Thank you for your help.  I confirm that guidance regarding antiplatelet and oral anticoagulation therapy has been completed and, if necessary, noted below.  None requested  I also confirmed the patient resides in the state of [place] . As per [facility] Medical [employer] telemedicine laws, the patient must reside in the state in which the provider is licensed.   [name], NP 08/26/2024, 2:50 PM [facility]    "

## 2024-09-08 ENCOUNTER — Encounter

## 2024-09-15 ENCOUNTER — Ambulatory Visit

## 2024-09-24 ENCOUNTER — Encounter

## 2024-10-16 ENCOUNTER — Encounter

## 2024-12-08 ENCOUNTER — Encounter

## 2024-12-24 ENCOUNTER — Encounter

## 2025-01-15 ENCOUNTER — Encounter

## 2025-03-09 ENCOUNTER — Encounter

## 2025-03-25 ENCOUNTER — Encounter

## 2025-04-16 ENCOUNTER — Encounter

## 2025-06-08 ENCOUNTER — Encounter

## 2025-06-24 ENCOUNTER — Encounter

## 2025-07-16 ENCOUNTER — Encounter

## 2025-09-07 ENCOUNTER — Encounter

## 2025-09-23 ENCOUNTER — Encounter

## 2025-10-15 ENCOUNTER — Encounter

## 2025-12-07 ENCOUNTER — Encounter

## 2025-12-23 ENCOUNTER — Encounter
